# Patient Record
Sex: Female | Born: 1990
Health system: Southern US, Community
[De-identification: ages and names within clinical notes are randomized; demographics above are authoritative.]

## PROBLEM LIST (undated history)

## (undated) DIAGNOSIS — S92301A Fracture of unspecified metatarsal bone(s), right foot, initial encounter for closed fracture: Secondary | ICD-10-CM

## (undated) DIAGNOSIS — IMO0002 Reserved for concepts with insufficient information to code with codable children: Secondary | ICD-10-CM

## (undated) DIAGNOSIS — R109 Unspecified abdominal pain: Secondary | ICD-10-CM

## (undated) DIAGNOSIS — H6690 Otitis media, unspecified, unspecified ear: Secondary | ICD-10-CM

## (undated) HISTORY — DX: Fracture of unspecified metatarsal bone(s), right foot, initial encounter for closed fracture: S92.301A

## (undated) HISTORY — DX: Reserved for concepts with insufficient information to code with codable children: IMO0002

## (undated) HISTORY — PX: ANTERIOR CRUCIATE LIGAMENT REPAIR: SHX115

## (undated) HISTORY — PX: TYMPANOSTOMY: SHX2586

## (undated) HISTORY — DX: Otitis media, unspecified, unspecified ear: H66.90

## (undated) HISTORY — DX: Unspecified abdominal pain: R10.9

---
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
## 2012-01-16 ENCOUNTER — Other Ambulatory Visit: Payer: Self-pay | Admitting: Internal Medicine

## 2012-01-18 ENCOUNTER — Other Ambulatory Visit: Payer: Self-pay | Admitting: Internal Medicine

## 2012-03-14 ENCOUNTER — Other Ambulatory Visit: Payer: Self-pay | Admitting: Internal Medicine

## 2012-03-16 ENCOUNTER — Other Ambulatory Visit: Payer: Self-pay | Admitting: Internal Medicine

## 2012-03-20 ENCOUNTER — Other Ambulatory Visit: Payer: Self-pay | Admitting: Internal Medicine

## 2012-03-27 ENCOUNTER — Other Ambulatory Visit: Payer: Self-pay | Admitting: Internal Medicine

## 2012-07-25 ENCOUNTER — Telehealth: Payer: Self-pay | Admitting: Internal Medicine

## 2012-07-25 NOTE — Telephone Encounter (Signed)
Order abdominal ultrasound  be done first   Dx  abd pain possible hemangioma of liver   Then CPX preventive viist any time except Thursdays .    As a work ok  in  [employer]

## 2012-07-25 NOTE — Telephone Encounter (Signed)
PT coming home form school for the entire month of may. Pt's mother stated that the pt has a "spot on her liver", and that you had wanted her to repeat a scan that she had done previously. She would like to also have a physical. Please let me know what to schedule, and where to put it, considering your schedule. Thank you!

## 2012-07-28 ENCOUNTER — Other Ambulatory Visit: Payer: Self-pay | Admitting: Family Medicine

## 2012-07-28 DIAGNOSIS — R109 Unspecified abdominal pain: Secondary | ICD-10-CM

## 2012-07-28 NOTE — Telephone Encounter (Signed)
I put in the order for the ultrasound.   Please make CPE per [facility]  See below.

## 2012-07-29 NOTE — Telephone Encounter (Signed)
lmom for mom vm to callback

## 2012-07-31 NOTE — Telephone Encounter (Signed)
Pt is sch for 5-30

## 2012-08-04 ENCOUNTER — Ambulatory Visit
Admission: RE | Admit: 2012-08-04 | Discharge: 2012-08-04 | Disposition: A | Discharge status: Home / Self Care | Payer: 59 | Source: Ambulatory Visit | Attending: Internal Medicine | Admitting: Internal Medicine

## 2012-08-04 DIAGNOSIS — R109 Unspecified abdominal pain: Secondary | ICD-10-CM

## 2012-08-22 ENCOUNTER — Ambulatory Visit (INDEPENDENT_AMBULATORY_CARE_PROVIDER_SITE_OTHER): Payer: 59 | Admitting: Internal Medicine

## 2012-08-22 ENCOUNTER — Encounter: Payer: Self-pay | Admitting: Internal Medicine

## 2012-08-22 VITALS — BP 110/80 | HR 78 | Temp 98.1°F | Ht 70.5 in | Wt 169.0 lb

## 2012-08-22 DIAGNOSIS — Z113 Encounter for screening for infections with a predominantly sexual mode of transmission: Secondary | ICD-10-CM

## 2012-08-22 DIAGNOSIS — J45990 Exercise induced bronchospasm: Secondary | ICD-10-CM

## 2012-08-22 DIAGNOSIS — D1803 Hemangioma of intra-abdominal structures: Secondary | ICD-10-CM

## 2012-08-22 DIAGNOSIS — Z0000 Encounter for general adult medical examination without abnormal findings: Secondary | ICD-10-CM

## 2012-08-22 DIAGNOSIS — Z3041 Encounter for surveillance of contraceptive pills: Secondary | ICD-10-CM

## 2012-08-22 LAB — HEPATIC FUNCTION PANEL
AST: 19 U/L (ref 0–37)
Alkaline Phosphatase: 36 U/L — ABNORMAL LOW (ref 39–117)
Total Bilirubin: 0.7 mg/dL (ref 0.3–1.2)

## 2012-08-22 LAB — BASIC METABOLIC PANEL
BUN: 18 mg/dL (ref 6–23)
Calcium: 9.6 mg/dL (ref 8.4–10.5)
GFR: 91.27 mL/min (ref >60.00)
Glucose, Bld: 83 mg/dL (ref 70–99)

## 2012-08-22 LAB — CBC WITH DIFFERENTIAL/PLATELET
Basophils Absolute: 0 10*3/uL (ref 0.0–0.1)
Lymphocytes Relative: 49 % — ABNORMAL HIGH (ref 12.0–46.0)
Monocytes Relative: 10.7 % (ref 3.0–12.0)
Platelets: 240 10*3/uL (ref 150.0–400.0)
RDW: 13 % (ref 11.5–14.6)

## 2012-08-22 LAB — LIPID PANEL
Cholesterol: 203 mg/dL — ABNORMAL HIGH (ref 0–200)
Total CHOL/HDL Ratio: 4
VLDL: 7.4 mg/dL (ref 0.0–40.0)

## 2012-08-22 MED ORDER — NORETHIN ACE-ETH ESTRAD-FE 1-20 MG-MCG PO TABS: ORAL_TABLET | ORAL | Status: DC

## 2012-08-22 MED ORDER — ALBUTEROL SULFATE HFA 108 (90 BASE) MCG/ACT IN AERS: INHALATION_SPRAY | RESPIRATORY_TRACT | Status: DC

## 2012-08-22 NOTE — Patient Instructions (Signed)
Continue lifestyle intervention healthy eating and exercise . The  area on the liver is stable and appears like a benign hemangioma a very common  Condition.  It is small and stable and thus no need for regular checks at this time.    Will notify you  of labs when available. Sign up for my chart .    Health Maintenance, 80- to 20-Year-Old SCHOOL PERFORMANCE After high school completion, the young adult may be attending college, Scientist, product/process development or vocational school, or entering the [employer] or the work force. SOCIAL AND EMOTIONAL DEVELOPMENT The young adult establishes adult relationships and explores sexual identity. Young adults may be living at home or in a college dorm or apartment. Increasing independence is important with young adults. Throughout adolescence, teens should assume responsibility of their own health care. IMMUNIZATIONS Most young adults should be fully vaccinated. A booster dose of Tdap (tetanus, diphtheria, and pertussis, or "whooping cough"), a dose of meningococcal vaccine to protect against a certain type of bacterial meningitis, hepatitis A, human papillomarvirus (HPV), chickenpox, or measles vaccines may be indicated, if not given at an earlier age. Annual influenza or "flu" vaccination should be considered during flu season.  TESTING Annual screening for vision and hearing problems is recommended. Vision should be screened objectively at least once between 97 and 26 years of age. The young adult may be screened for anemia or tuberculosis. Young adults should have a blood test to check for high cholesterol during this time period. Young adults should be screened for use of alcohol and drugs. If the young adult is sexually active, screening for sexually transmitted infections, pregnancy, or HIV may be performed. Screening for cervical cancer should be performed within 3 years of beginning sexual activity. NUTRITION AND ORAL HEALTH  Adequate calcium intake is important. Consume 3  servings of low-fat milk and dairy products daily. For those who do not drink milk or consume dairy products, calcium enriched foods, such as juice, bread, or cereal, dark, leafy greens, or canned fish are alternate sources of calcium.  Drink plenty of water. Limit fruit juice to 8 to 12 ounces per day. Avoid sugary beverages or sodas.  Discourage skipping meals, especially breakfast. Teens should eat a good variety of vegetables and fruits, as well as lean meats.  Avoid high fat, high salt, and high sugar foods, such as candy, chips, and cookies.  Encourage young adults to participate in meal planning and preparation.  Eat meals together as a family whenever possible. Encourage conversation at mealtime.  Limit fast food choices and eating out at restaurants.  Brush teeth twice a day and floss.  Schedule dental exams twice a year. SLEEP Regular sleep habits are important. PHYSICAL, SOCIAL, AND EMOTIONAL DEVELOPMENT  One hour of regular physical activity daily is recommended. Continue to participate in sports.  Encourage young adults to develop their own interests and consider community service or volunteerism.  Provide guidance to the young adult in making decisions about college and work plans.  Make sure that young adults know that they should never be in a situation that makes them uncomfortable, and they should tell partners if they do not want to engage in sexual activity.  Talk to the young adult about body image. Eating disorders may be noted at this time. Young adults may also be concerned about being overweight. Monitor the young adult for weight gain or loss.  Mood disturbances, depression, anxiety, alcoholism, or attention problems may be noted in young adults. Talk to the caregiver  if there are concerns about mental illness.  Negotiate limit setting and independent decision making.  Encourage the young adult to handle conflict without physical violence.  Avoid loud  noises which may impair hearing.  Limit television and computer time to 2 hours per day. Individuals who engage in excessive sedentary activity are more likely to become overweight. RISK BEHAVIORS  Sexually active young adults need to take precautions against pregnancy and sexually transmitted infections. Talk to young adults about contraception.  Provide a tobacco-free and drug-free environment for the young adult. Talk to the young adult about drug, tobacco, and alcohol use among friends or at friends' homes. Make sure the young adult knows that smoking tobacco or marijuana and taking drugs have health consequences and may impact brain development.  Teach the young adult about appropriate use of over-the-counter or prescription medicines.  Establish guidelines for driving and for riding with friends.  Talk to young adults about the risks of drinking and driving or boating. Encourage the young adult to call you if he or she or friends have been drinking or using drugs.  Remind young adults to wear seat belts at all times in cars and life vests in boats.  Young adults should always wear a properly fitted helmet when they are riding a bicycle.  Use caution with all-terrain vehicles (ATVs) or other motorized vehicles.  Do not keep handguns in the home. (If you do, the gun and ammunition should be locked separately and out of the young adult's access.)  Equip your home with smoke detectors and change the batteries regularly. Make sure all family members know the fire escape plans for your home.  Teach young adults not to swim alone and not to dive in shallow water.  All individuals should wear sunscreen that protects against UVA and UVB light with at least a sun protection factor (SPF) of 30 when out in the sun. This minimizes sun burning. WHAT'S NEXT? Young adults should visit their pediatrician or family physician yearly. By young adulthood, health care should be transitioned to a family  physician or internal medicine specialist. Sexually active females may want to begin annual physical exams with a gynecologist. Document Released: 06/07/2006 Document Revised: 06/04/2011 Document Reviewed: 06/27/2006 [facility] Patient Information 2014 [place], [place].

## 2012-08-22 NOTE — Progress Notes (Signed)
Chief Complaint  Patient presents with  . Annual Exam    HPI: Patient comes in today for Preventive Health Care visit  No major change in health status since last visit . Still playing basketballl.  No major injury concern.     EIA  No flare uses albuterol pre  exercise Avoiding dairy and does well.   NO abd pain .   Had repeat US  Pre visit  OCPs work well for periods  And no cramps bleed 3 days  ROS:  GEN/ HEENT: No fever, significant weight changes sweats headaches vision problems hearing changes, CV/ PULM; No chest pain shortness of breath cough, syncope,edema  change in exercise tolerance. GI /GU: No adominal pain, vomiting, change in bowel habits. No blood in the stool. No significant GU symptoms. SKIN/HEME: ,no acute skin rashes suspicious lesions or bleeding. No lymphadenopathy, nodules, masses.  NEURO/ PSYCH:  No neurologic signs such as weakness numbness. No depression anxiety. IMM/ Allergy: No unusual infections.  Allergy .   REST of 12 system review negative except as per HPI   Past Medical History  Diagnosis Date  . OM (otitis media)     hx  . Abdominal pain, recurrent 06/20/2011  . SHIN SPLINTS 04/21/2009    Qualifier: Diagnosis of  By: Fabian Sharp MD, Neta Mends     Family History  Problem Relation Age of Onset  . Asthma Mother   . Hyperlipidemia Father   . Obesity Sister   . Asthma Brother     History   Social History  . Marital Status: Single    Spouse Name: N/A    Number of Children: N/A  . Years of Education: N/A   Social History Main Topics  . Smoking status: Passive Smoke Exposure - Never Smoker  . Smokeless tobacco: None  . Alcohol Use:   . Drug Use:   . Sexually Active:    Other Topics Concern  . None   Social History Narrative   HH of 5   Born in [place]   Father smokes not in house   Pet dog   GDS  Graduated    Basket ball and  Track      [employer] on [employer] scholarship   Had a good freshman year  Undeclared major              Outpatient Encounter Prescriptions as of 08/22/2012  Medication Sig Dispense Refill  . albuterol (VENTOLIN HFA) 108 (90 BASE) MCG/ACT inhaler 2 puffs pre exercise  1 hours  2 Inhaler  2  . norethindrone-ethinyl estradiol (JUNEL FE 1/20) 1-20 MG-MCG tablet TAKE 1 TABLET BY MOUTH DAILY.  3 Package  3  . [DISCONTINUED] albuterol (VENTOLIN HFA) 108 (90 BASE) MCG/ACT inhaler 2 puffs pre exercise  1 hours  1 Inhaler  2  . [DISCONTINUED] JUNEL FE 1/20 1-20 MG-MCG tablet TAKE 1 TABLET BY MOUTH DAILY.  28 tablet  1  . [DISCONTINUED] JUNEL FE 1/20 1-20 MG-MCG tablet TAKE 1 TABLET BY MOUTH DAILY.  28 tablet  5  . [DISCONTINUED] JUNEL FE 1/20 1-20 MG-MCG tablet TAKE 1 TABLET BY MOUTH DAILY.  28 tablet  0  . [DISCONTINUED] Lactic Acid 10 % LOTN APPLY TO UPPER ARM AREA TWO TIMES A DAY OR AS DIRECTED  354.84 mL  0  . [DISCONTINUED] selenium sulfide (SELSUN) 2.5 % shampoo APPLY TO TRUNK & LEAVE ON OVER NIGHT. WASH OFF THE NEXT DAY. USE FOR 1 WEEK, THEN PERIODICALLY  120 mL  0  No facility-administered encounter medications on file as of 08/22/2012.    EXAM:  BP 110/80  Pulse 78  Temp(Src) 98.1 F (36.7 C) (Oral)  Ht 5' 10.5" (1.791 m)  Wt 169 lb (76.658 kg)  BMI 23.9 kg/m2  SpO2 98%  LMP 08/18/2012  Body mass index is 23.9 kg/(m^2).  Physical Exam: Vital signs reviewed [id number]:WRUE is a well-developed well-nourished alert cooperative   female who appears her stated age in no acute distress.  HEENT: normocephalic atraumatic , Eyes: PERRL EOM's full, conjunctiva clear, Nares: paten,t no deformity discharge or tenderness., Ears: no deformity EAC's clear TMs with normal landmarks. Mouth: clear OP, no lesions, edema.  Moist mucous membranes. Dentition in adequate repair. NECK: supple without masses, thyromegaly or bruits. CHEST/PULM:  Clear to auscultation and percussion breath sounds equal no wheeze , rales or rhonchi. No chest wall deformities or tenderness. CV: PMI is nondisplaced, S1 S2 no gallops,  murmurs, rubs. Peripheral pulses are full without delay.No JVD .  Breast: normal by inspection . No dimpling, discharge, masses, tenderness or discharge . Some lumipness no discrete lump. ABDOMEN: Bowel sounds normal nontender  No guard or rebound, no hepato splenomegal no CVA tenderness.  No hernia. Extremtities:  No clubbing cyanosis or edema, no acute joint swelling or redness no focal atrophy NEURO:  Oriented x3, cranial nerves 3-12 appear to be intact, no obvious focal weakness,gait within normal limits no abnormal reflexes or asymmetrical SKIN: No acute rashes normal turgor, color, no bruising or petechiae.  Faded tinea? On back  No acute rashes PSYCH: Oriented, good eye contact, no obvious depression anxiety, cognition and judgment appear normal. LN: no cervical axillary inguinal adenopathy Screening ortho / MS exam: normal;  No scoliosis ,LOM , joint swelling or gait disturbance . Muscle mass is normal .    Lab Results  Component Value Date   WBC 3.0* 08/22/2012   HGB 14.0 08/22/2012   HCT 41.4 08/22/2012   PLT 240.0 08/22/2012   GLUCOSE 83 08/22/2012   CHOL 203* 08/22/2012   TRIG 37.0 08/22/2012   HDL 53.30 08/22/2012   LDLDIRECT 144.3 08/22/2012   ALT 14 08/22/2012   AST 19 08/22/2012   NA 140 08/22/2012   K 3.9 08/22/2012   CL 108 08/22/2012   CREATININE 1.0 08/22/2012   BUN 18 08/22/2012   CO2 26 08/22/2012   TSH 2.06 08/22/2012   Wt Readings from Last 3 Encounters:  08/22/12 169 lb (76.658 kg) (91%*, Z = 1.37)  09/14/11 162 lb (73.483 kg) (90%*, Z = 1.27)  07/11/11 157 lb (71.215 kg) (88%*, Z = 1.16)   * Growth percentiles are based on CDC 2-20 Years data.    ASSESSMENT AND PLAN:  Discussed the following assessment and plan:  Visit for preventive health examination - utd disc screening - Plan: Basic metabolic panel, CBC with Differential, Hepatic function panel, Lipid panel, TSH  Hemangioma of liver 1.8 cm on Ultrasound 2013 - no change appears benign  2014  Exercise-induced asthma - refill med  Oral contraceptive use  - ascceptable candidate   Routine screening for STI (sexually transmitted infection) - Plan: GC/chlamydia probe amp, urine Counseled regarding healthy nutrition, exercise, sleep, injury prevention, calcium vit d and healthy weight . Refills given  Can contact pharmacy about getting refills at school  Or [place] if needed to transfer rx. Patient Care Team: Madelin Headings, MD as PCP - General Patient Instructions  Continue lifestyle intervention healthy eating and exercise . The  area on the  liver is stable and appears like a benign hemangioma a very common  Condition.  It is small and stable and thus no need for regular checks at this time.    Will notify you  of labs when available. Sign up for my chart .    Health Maintenance, 36- to 37-Year-Old SCHOOL PERFORMANCE After high school completion, the young adult may be attending college, Scientist, product/process development or vocational school, or entering the [employer] or the work force. SOCIAL AND EMOTIONAL DEVELOPMENT The young adult establishes adult relationships and explores sexual identity. Young adults may be living at home or in a college dorm or apartment. Increasing independence is important with young adults. Throughout adolescence, teens should assume responsibility of their own health care. IMMUNIZATIONS Most young adults should be fully vaccinated. A booster dose of Tdap (tetanus, diphtheria, and pertussis, or "whooping cough"), a dose of meningococcal vaccine to protect against a certain type of bacterial meningitis, hepatitis A, human papillomarvirus (HPV), chickenpox, or measles vaccines may be indicated, if not given at an earlier age. Annual influenza or "flu" vaccination should be considered during flu season.  TESTING Annual screening for vision and hearing problems is recommended. Vision should be screened objectively at least once between 39 and 46 years of age. The young adult may  be screened for anemia or tuberculosis. Young adults should have a blood test to check for high cholesterol during this time period. Young adults should be screened for use of alcohol and drugs. If the young adult is sexually active, screening for sexually transmitted infections, pregnancy, or HIV may be performed. Screening for cervical cancer should be performed within 3 years of beginning sexual activity. NUTRITION AND ORAL HEALTH  Adequate calcium intake is important. Consume 3 servings of low-fat milk and dairy products daily. For those who do not drink milk or consume dairy products, calcium enriched foods, such as juice, bread, or cereal, dark, leafy greens, or canned fish are alternate sources of calcium.  Drink plenty of water. Limit fruit juice to 8 to 12 ounces per day. Avoid sugary beverages or sodas.  Discourage skipping meals, especially breakfast. Teens should eat a good variety of vegetables and fruits, as well as lean meats.  Avoid high fat, high salt, and high sugar foods, such as candy, chips, and cookies.  Encourage young adults to participate in meal planning and preparation.  Eat meals together as a family whenever possible. Encourage conversation at mealtime.  Limit fast food choices and eating out at restaurants.  Brush teeth twice a day and floss.  Schedule dental exams twice a year. SLEEP Regular sleep habits are important. PHYSICAL, SOCIAL, AND EMOTIONAL DEVELOPMENT  One hour of regular physical activity daily is recommended. Continue to participate in sports.  Encourage young adults to develop their own interests and consider community service or volunteerism.  Provide guidance to the young adult in making decisions about college and work plans.  Make sure that young adults know that they should never be in a situation that makes them uncomfortable, and they should tell partners if they do not want to engage in sexual activity.  Talk to the young adult  about body image. Eating disorders may be noted at this time. Young adults may also be concerned about being overweight. Monitor the young adult for weight gain or loss.  Mood disturbances, depression, anxiety, alcoholism, or attention problems may be noted in young adults. Talk to the caregiver if there are concerns about mental illness.  Negotiate limit setting and  independent decision making.  Encourage the young adult to handle conflict without physical violence.  Avoid loud noises which may impair hearing.  Limit television and computer time to 2 hours per day. Individuals who engage in excessive sedentary activity are more likely to become overweight. RISK BEHAVIORS  Sexually active young adults need to take precautions against pregnancy and sexually transmitted infections. Talk to young adults about contraception.  Provide a tobacco-free and drug-free environment for the young adult. Talk to the young adult about drug, tobacco, and alcohol use among friends or at friends' homes. Make sure the young adult knows that smoking tobacco or marijuana and taking drugs have health consequences and may impact brain development.  Teach the young adult about appropriate use of over-the-counter or prescription medicines.  Establish guidelines for driving and for riding with friends.  Talk to young adults about the risks of drinking and driving or boating. Encourage the young adult to call you if he or she or friends have been drinking or using drugs.  Remind young adults to wear seat belts at all times in cars and life vests in boats.  Young adults should always wear a properly fitted helmet when they are riding a bicycle.  Use caution with all-terrain vehicles (ATVs) or other motorized vehicles.  Do not keep handguns in the home. (If you do, the gun and ammunition should be locked separately and out of the young adult's access.)  Equip your home with smoke detectors and change the batteries  regularly. Make sure all family members know the fire escape plans for your home.  Teach young adults not to swim alone and not to dive in shallow water.  All individuals should wear sunscreen that protects against UVA and UVB light with at least a sun protection factor (SPF) of 30 when out in the sun. This minimizes sun burning. WHAT'S NEXT? Young adults should visit their pediatrician or family physician yearly. By young adulthood, health care should be transitioned to a family physician or internal medicine specialist. Sexually active females may want to begin annual physical exams with a gynecologist. Document Released: 06/07/2006 Document Revised: 06/04/2011 Document Reviewed: 06/27/2006 [facility] Patient Information 2014 [place], [place].     Neta Mends. Dory Verdun M.D.

## 2012-08-23 ENCOUNTER — Encounter: Payer: Self-pay | Admitting: Internal Medicine

## 2012-08-23 DIAGNOSIS — Z113 Encounter for screening for infections with a predominantly sexual mode of transmission: Secondary | ICD-10-CM | POA: Insufficient documentation

## 2012-08-25 LAB — GC/CHLAMYDIA PROBE AMP, URINE: GC Probe Amp, Urine: NEGATIVE

## 2012-09-01 ENCOUNTER — Telehealth: Payer: Self-pay | Admitting: Family Medicine

## 2012-09-01 NOTE — Telephone Encounter (Signed)
Her lab work was sent for me to save for her next OV.  She does not have an upcoming appt.  What would you have me tell her about her labs?

## 2012-09-02 ENCOUNTER — Telehealth: Payer: Self-pay | Admitting: Family Medicine

## 2012-09-02 NOTE — Telephone Encounter (Signed)
eRROR 

## 2012-09-02 NOTE — Telephone Encounter (Signed)
Yes send her a copy of labs  See result note.

## 2012-09-03 NOTE — Telephone Encounter (Signed)
Done

## 2012-09-18 ENCOUNTER — Other Ambulatory Visit: Payer: Self-pay | Admitting: Internal Medicine

## 2012-09-22 ENCOUNTER — Other Ambulatory Visit: Payer: Self-pay | Admitting: Internal Medicine

## 2012-09-23 ENCOUNTER — Other Ambulatory Visit: Payer: Self-pay | Admitting: Internal Medicine

## 2012-10-02 ENCOUNTER — Encounter: Payer: Self-pay | Admitting: Internal Medicine

## 2012-10-02 ENCOUNTER — Ambulatory Visit (INDEPENDENT_AMBULATORY_CARE_PROVIDER_SITE_OTHER): Payer: 59 | Admitting: Internal Medicine

## 2012-10-02 VITALS — BP 100/60 | HR 75 | Temp 98.6°F | Wt 170.0 lb

## 2012-10-02 DIAGNOSIS — H6122 Impacted cerumen, left ear: Secondary | ICD-10-CM

## 2012-10-02 DIAGNOSIS — H938X2 Other specified disorders of left ear: Secondary | ICD-10-CM

## 2012-10-02 DIAGNOSIS — H6120 Impacted cerumen, unspecified ear: Secondary | ICD-10-CM

## 2012-10-02 DIAGNOSIS — H938X9 Other specified disorders of ear, unspecified ear: Secondary | ICD-10-CM

## 2012-10-02 DIAGNOSIS — H6982 Other specified disorders of Eustachian tube, left ear: Secondary | ICD-10-CM

## 2012-10-02 DIAGNOSIS — H6980 Other specified disorders of Eustachian tube, unspecified ear: Secondary | ICD-10-CM | POA: Insufficient documentation

## 2012-10-02 NOTE — Progress Notes (Signed)
Chief Complaint  Patient presents with  . Otalgia    Left side.  Ongoing for 1 week.    HPI: Patient comes in today for SDA for  new problem evaluation. Stuffy feeling off and on for about a week  And then discomfort this morning and then pop this am  And then proved symptoms no drainage now feels like it is clogging up some again down no drainage  Now  clogging up again.    Not really pain  at all .  Has had a history of otitis media in the past that she believes   No fever upper respiratory cold symptoms or increased allergy symptoms. No recent swimming diving has put nothing in her ear. ROS: See pertinent positives and negatives per HPI.  Past Medical History  Diagnosis Date  . OM (otitis media)     hx  . Abdominal pain, recurrent 06/20/2011  . SHIN SPLINTS 04/21/2009    Qualifier: Diagnosis of  By: Fabian Sharp MD, Neta Mends     Family History  Problem Relation Age of Onset  . Asthma Mother   . Hyperlipidemia Father   . Obesity Sister   . Asthma Brother     History   Social History  . Marital Status: Single    Spouse Name: N/A    Number of Children: N/A  . Years of Education: N/A   Social History Main Topics  . Smoking status: Passive Smoke Exposure - Never Smoker  . Smokeless tobacco: None  . Alcohol Use:   . Drug Use:   . Sexually Active:    Other Topics Concern  . None   Social History Narrative   HH of 5   Born in [place]   Father smokes not in house   Pet dog   GDS  Graduated    Basket ball and  Track      [employer] on [employer] scholarship   Had a good freshman year  Undeclared major             Outpatient Encounter Prescriptions as of 10/02/2012  Medication Sig Dispense Refill  . albuterol (VENTOLIN HFA) 108 (90 BASE) MCG/ACT inhaler 2 puffs pre exercise  1 hours  2 Inhaler  2  . JUNEL FE 1/20 1-20 MG-MCG tablet TAKE 1 TABLET BY MOUTH DAILY.  28 tablet  5   No facility-administered encounter medications on file as of 10/02/2012.    EXAM:  BP 100/60   Pulse 75  Temp(Src) 98.6 F (37 C) (Oral)  Wt 170 lb (77.111 kg)  BMI 24.04 kg/m2  SpO2 98%  LMP 09/17/2012  Body mass index is 24.04 kg/(m^2).  GENERAL: vitals reviewed and listed above, alert, oriented, appears well hydrated and in no acute distress  HEENT: atraumatic, conjunctiva  clear, no obvious abnormalities on inspection of external nose and ears right EAC small amount of wax TM intact clear bony landmarks left EAC occluded with soft wax irrigated wax almost entirely removed TM intact without acute abnormalities no obvious effusion. Face nontender  OP : no lesion edema or exudate  NECK: no obvious masses on inspection palpation no adenopathy MS: moves all extremities without noticeable focal  abnormality PSYCH: pleasant and cooperative, no obvious depression or anxiety  ASSESSMENT AND PLAN:  Discussed the following assessment and plan:  Clogged ear, left - Most likely from wax impaction however she could have some mild eustachian tube dysfunction she is a lot better after wax removal  Excess wax in ear,  left  Eustachian tube dysfunction, left Discussed differential diagnosis possible eustachian tube dysfunction can try Sudafed if needed give it time if persistent and progressive or recurs come back or contact us for advice no infection today -Patient advised to return or notify health care team  if symptoms worsen or persist or new concerns arise.  Patient Instructions  This is not  An  ear infection .  However could have eustachian tube dysfunction and wax clogging the area.  Can try  Sudafed if needed. Or nothing   No q tips or  Objects in ears.   As you haven't seen.     Neta Mends. Kylle Lall M.D.

## 2012-10-02 NOTE — Patient Instructions (Addendum)
This is not  An  ear infection .  However could have eustachian tube dysfunction and wax clogging the area.  Can try  Sudafed if needed. Or nothing   No q tips or  Objects in ears.   As you haven't seen.

## 2013-06-29 IMAGING — US US ABDOMEN COMPLETE
1 series · 14 of 25 positions shown · non-contrast
Comparison: None.

CLINICAL DATA: Intermittent mid to right abdomen pain, some weight
loss

COMPLETE ABDOMINAL ULTRASOUND

[Series 1: us abdomen complete · 0.26mm/px · 14 of 87 slices shown]
[im 1/87]
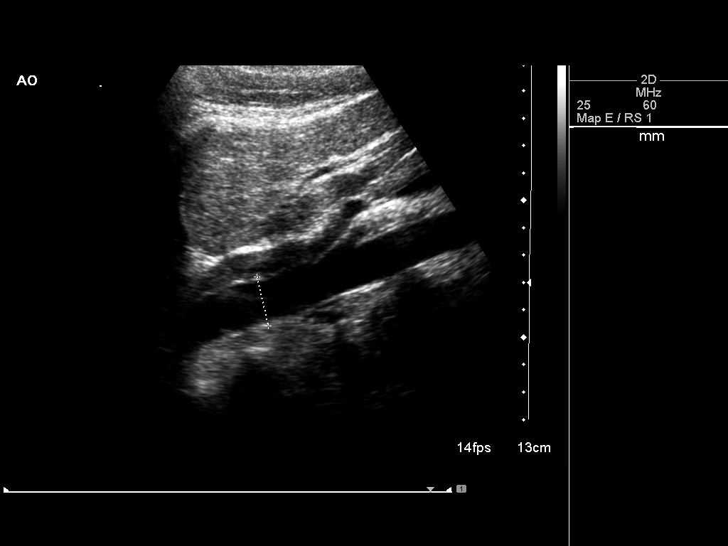
[im 8/87]
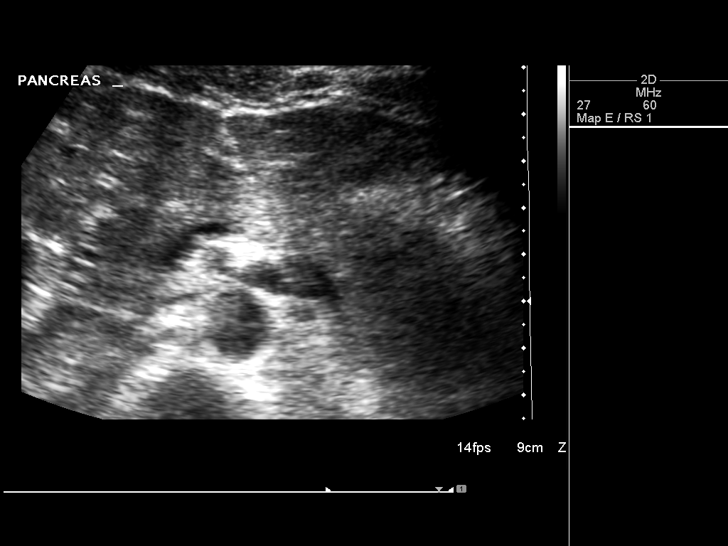
[im 15/87]
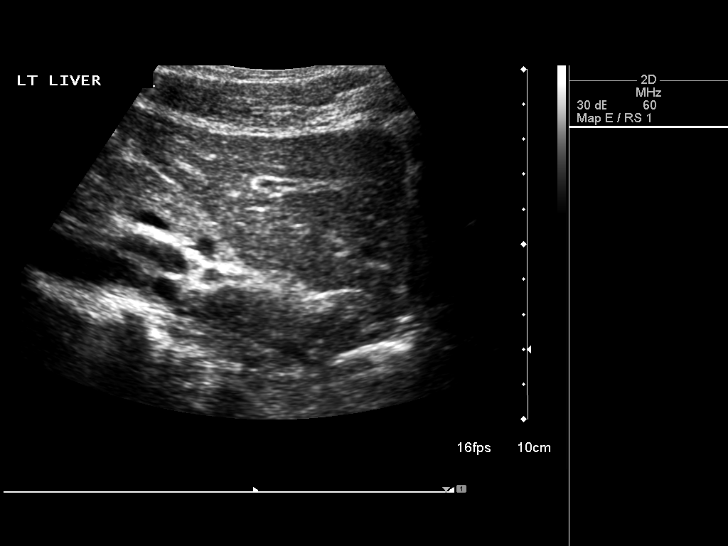
[im 22/87]
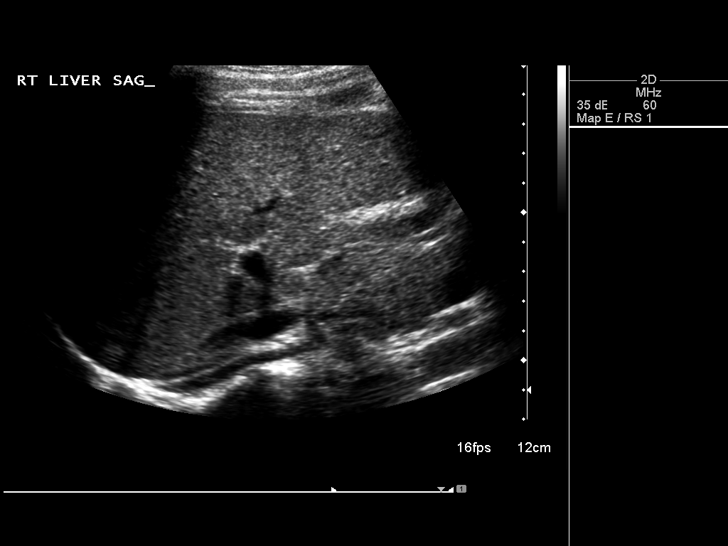
[im 29/87]
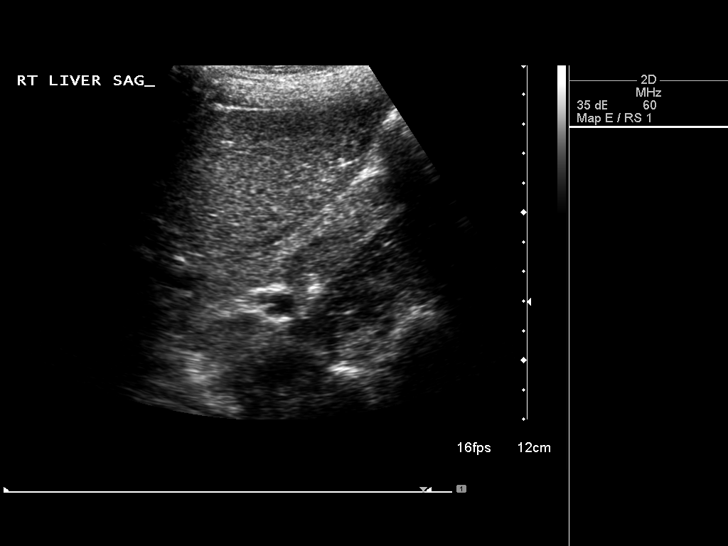
[im 33/87]
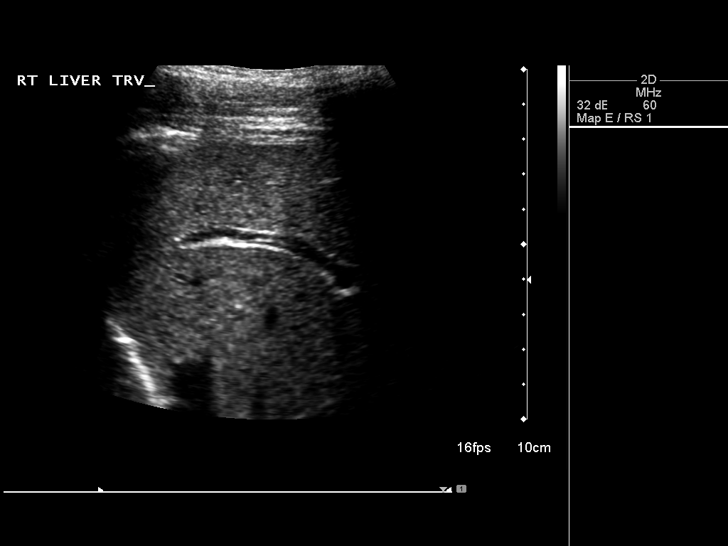
[im 40/87]
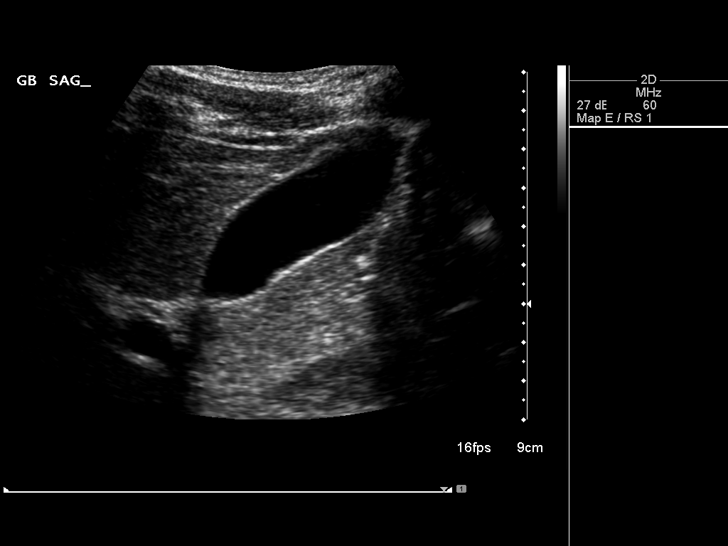
[im 47/87]
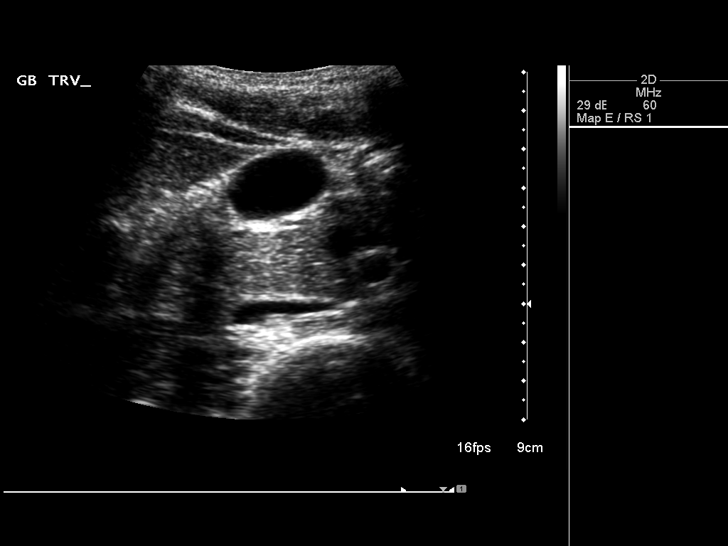
[im 54/87]
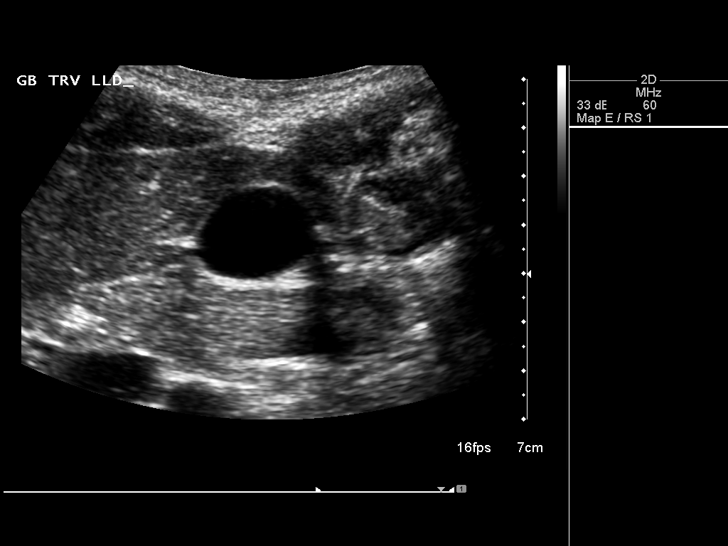
[im 58/87]
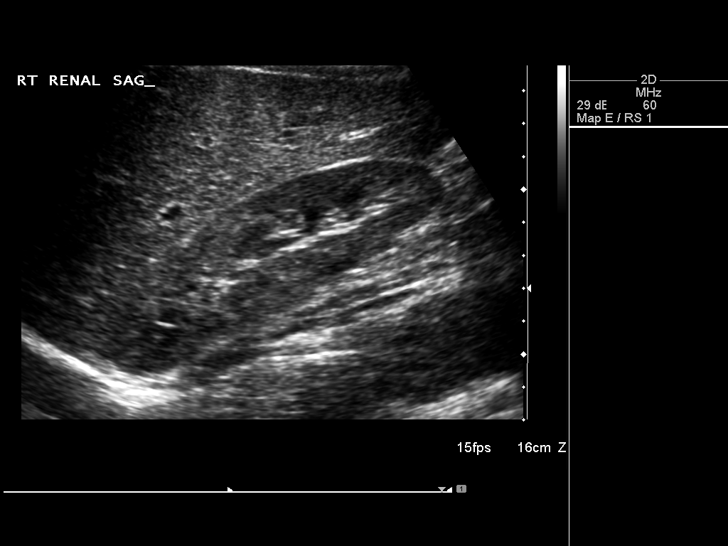
[im 65/87]
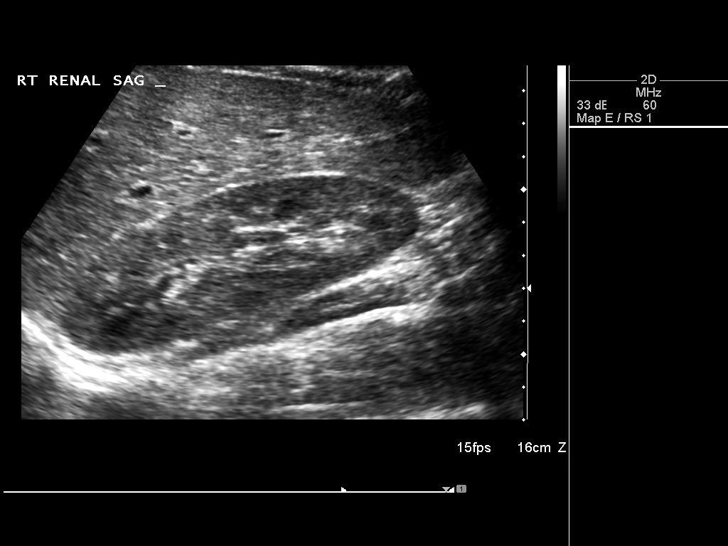
[im 72/87]
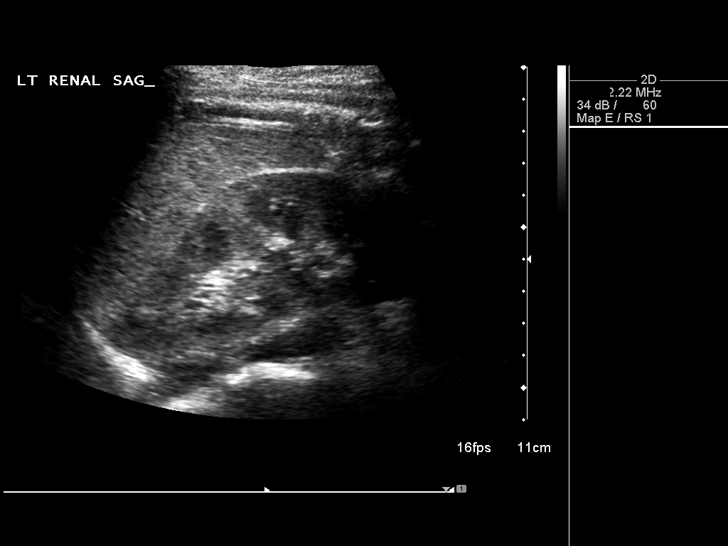
[im 79/87]
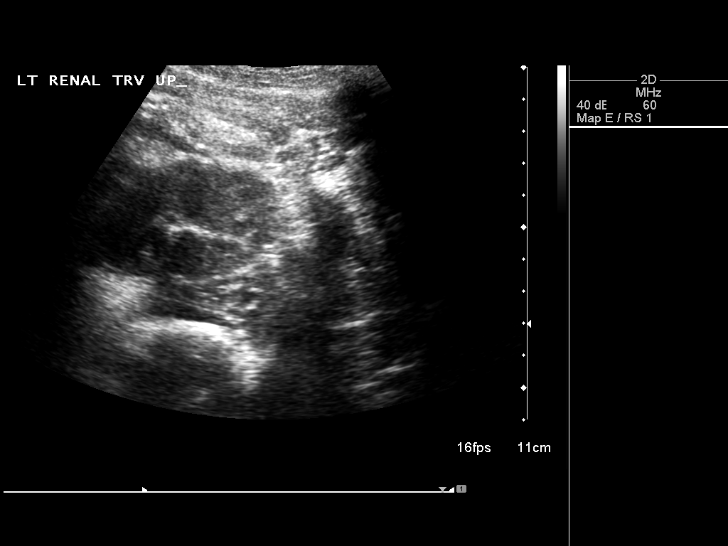
[im 87/87]
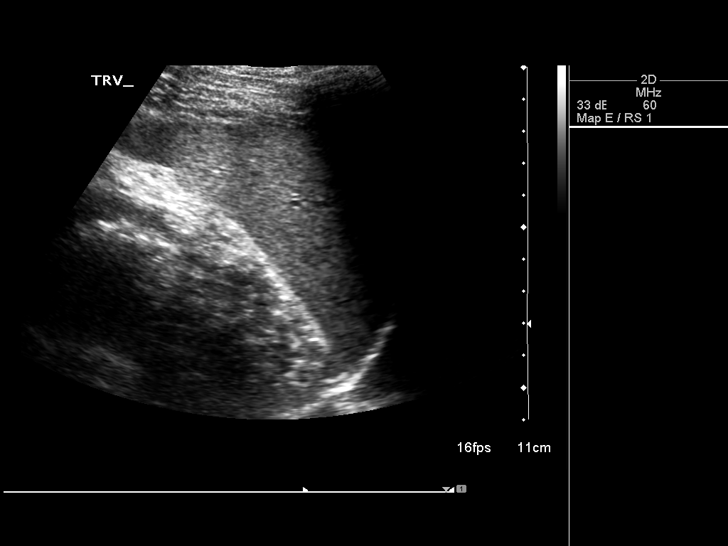

[14 of 25 positions shown; findings below may reference images not displayed]

FINDINGS: Gallbladder:  The gallbladder is visualized and no gallstones are
noted.  There is no pain over the gallbladder with compression.

Common bile duct:  The common bile duct is normal measuring 3.2 mm
in diameter.

Liver:  The liver has a normal echogenic pattern with exception of
a single small echogenic focus posteriorly in the right lobe
consistent with hemangioma of 1.5 x 1.7 x 1.8 cm.

IVC:  Appears normal.

Pancreas:  No focal abnormality seen.

Spleen:  The spleen is normal measuring 7.6 cm sagittally.

Right Kidney:  No hydronephrosis is seen.  The right kidney
measures 11.6 cm sagittally.

Left Kidney:  No hydronephrosis.  The left kidney measures 10.7 cm.

Abdominal aorta:  The abdominal aorta is normal in caliber.
IMPRESSION: Negative abdominal ultrasound other than a probable hemangioma in
the right lobe of liver of 1.8 cm maximum diameter.

## 2013-08-20 ENCOUNTER — Other Ambulatory Visit: Payer: Self-pay | Admitting: Internal Medicine

## 2013-08-20 NOTE — Telephone Encounter (Signed)
Sent to the pharmacy.  Pt has upcoming CPE.

## 2013-10-07 ENCOUNTER — Ambulatory Visit (INDEPENDENT_AMBULATORY_CARE_PROVIDER_SITE_OTHER): Payer: 59 | Admitting: Internal Medicine

## 2013-10-07 ENCOUNTER — Encounter: Payer: Self-pay | Admitting: Internal Medicine

## 2013-10-07 VITALS — BP 126/72 | Temp 99.0°F | Ht 71.0 in | Wt 177.0 lb

## 2013-10-07 DIAGNOSIS — Z0000 Encounter for general adult medical examination without abnormal findings: Secondary | ICD-10-CM

## 2013-10-07 DIAGNOSIS — Z8739 Personal history of other diseases of the musculoskeletal system and connective tissue: Secondary | ICD-10-CM

## 2013-10-07 DIAGNOSIS — Z3041 Encounter for surveillance of contraceptive pills: Secondary | ICD-10-CM

## 2013-10-07 DIAGNOSIS — Z113 Encounter for screening for infections with a predominantly sexual mode of transmission: Secondary | ICD-10-CM

## 2013-10-07 DIAGNOSIS — Z87828 Personal history of other (healed) physical injury and trauma: Secondary | ICD-10-CM | POA: Insufficient documentation

## 2013-10-07 LAB — BASIC METABOLIC PANEL
BUN: 10 mg/dL (ref 6–23)
CALCIUM: 9.5 mg/dL (ref 8.4–10.5)
CO2: 25 meq/L (ref 19–32)
Chloride: 109 mEq/L (ref 96–112)
Creatinine, Ser: 1 mg/dL (ref 0.4–1.2)
GFR: 93.47 mL/min (ref >60.00)
Glucose, Bld: 90 mg/dL (ref 70–99)
POTASSIUM: 3.7 meq/L (ref 3.5–5.1)
SODIUM: 140 meq/L (ref 135–145)

## 2013-10-07 LAB — CBC WITH DIFFERENTIAL/PLATELET
BASOS PCT: 0.3 % (ref 0.0–3.0)
Basophils Absolute: 0 10*3/uL (ref 0.0–0.1)
Eosinophils Absolute: 0 10*3/uL (ref 0.0–0.7)
Eosinophils Relative: 1.2 % (ref 0.0–5.0)
HEMATOCRIT: 39.7 % (ref 36.0–46.0)
HEMOGLOBIN: 13.2 g/dL (ref 12.0–15.0)
LYMPHS ABS: 1.4 10*3/uL (ref 0.7–4.0)
LYMPHS PCT: 44.3 % (ref 12.0–46.0)
MCHC: 33.3 g/dL (ref 30.0–36.0)
MCV: 89.2 fl (ref 78.0–100.0)
MONOS PCT: 8.8 % (ref 3.0–12.0)
Monocytes Absolute: 0.3 10*3/uL (ref 0.1–1.0)
NEUTROS ABS: 1.5 10*3/uL (ref 1.4–7.7)
Neutrophils Relative %: 45.4 % (ref 43.0–77.0)
Platelets: 277 10*3/uL (ref 150.0–400.0)
RBC: 4.46 Mil/uL (ref 3.87–5.11)
RDW: 13.5 % (ref 11.5–14.6)
WBC: 3.3 10*3/uL — ABNORMAL LOW (ref 4.5–10.5)

## 2013-10-07 LAB — HEPATIC FUNCTION PANEL
ALBUMIN: 4.3 g/dL (ref 3.5–5.2)
ALK PHOS: 45 U/L (ref 39–117)
ALT: 16 U/L (ref 0–35)
AST: 24 U/L (ref 0–37)
Bilirubin, Direct: 0.1 mg/dL (ref 0.0–0.3)
TOTAL PROTEIN: 8 g/dL (ref 6.0–8.3)
Total Bilirubin: 0.7 mg/dL (ref 0.2–1.2)

## 2013-10-07 LAB — TSH: TSH: 0.61 u[IU]/mL (ref 0.35–5.50)

## 2013-10-07 LAB — LIPID PANEL
Cholesterol: 194 mg/dL (ref 0–200)
HDL: 53.2 mg/dL (ref >39.00)
LDL Cholesterol: 134 mg/dL — ABNORMAL HIGH (ref 0–99)
NONHDL: 140.8
Total CHOL/HDL Ratio: 4
Triglycerides: 36 mg/dL (ref 0.0–149.0)
VLDL: 7.2 mg/dL (ref 0.0–40.0)

## 2013-10-07 MED ORDER — NORETHIN ACE-ETH ESTRAD-FE 1-20 MG-MCG PO TABS: ORAL_TABLET | ORAL | Status: DC

## 2013-10-07 NOTE — Patient Instructions (Addendum)
Continue lifestyle intervention healthy eating and exercise . 150 minutes of exercise weeks  ,   weight  To healthy levels. Avoid trans fats and processed foods;  Increase fresh fruits and veges to 5 servings per day. And avoid sweet beverages  Including tea and juice. Mediterranean diet with olive oil and nuts have been noted to be heart and brain healthy .  Will notify you  of labs when available. Preventive visit in 1 year  Due for pap smear when you are 23 years. Your immunizations are Up to date. Stay healthy!

## 2013-10-07 NOTE — Progress Notes (Signed)
Pre visit review using our clinic review tool, if applicable. No additional management support is needed unless otherwise documented below in the visit note.  Chief Complaint  Patient presents with  . Annual Exam    HPI: Patient comes in today for Preventive Health Care visit  Update : Left acl repair  ; Raliegh Ip  . Rehabing.  Back to normal  Happened during Pick up basketball and landed wrong.  School going well sports management   Basketball training this summer  No classes  1 part  Periods light  ok on ocps  Please renew no gu gi sx .   Health Maintenance  Topic Date Due  . Tetanus/tdap  01/12/2012  . Influenza Vaccine  10/24/2013   Health Maintenance Review LIFESTYLE:  Exercise:  yes Tobacco/ETS:no Alcohol: no Sugar beverages: soda ocass juice 2-3 per day.  Water.  Sleep: 6-7 hours  Drug use: no  ROS:  GEN/ HEENT: No fever, significant weight changes sweats headaches vision problems hearing changes, CV/ PULM; No chest pain shortness of breath cough, syncope,edema  change in exercise tolerance. GI /GU: No adominal pain, vomiting, change in bowel habits. No blood in the stool. No significant GU symptoms. SKIN/HEME: ,no acute skin rashes suspicious lesions or bleeding. No lymphadenopathy, nodules, masses.  NEURO/ PSYCH:  No neurologic signs such as weakness numbness. No depression anxiety. IMM/ Allergy: No unusual infections.  Allergy .   REST of 12 system review negative except as per HPI   Past Medical History  Diagnosis Date  . OM (otitis media)     hx  . Abdominal pain, recurrent 06/20/2011  . SHIN SPLINTS 04/21/2009    Qualifier: Diagnosis of  By: Regis Bill MD, Standley Brooking    Past Surgical History  Procedure Laterality Date  . Tympanostomy      age 68  . Anterior cruciate ligament repair Left     murphy wainer 2015    Family History  Problem Relation Age of Onset  . Asthma Mother   . Hyperlipidemia Father   . Obesity Sister   . Asthma Brother      History   Social History  . Marital Status: Single    Spouse Name: N/A    Number of Children: N/A  . Years of Education: N/A   Social History Main Topics  . Smoking status: Passive Smoke Exposure - Never Smoker  . Smokeless tobacco: None  . Alcohol Use:   . Drug Use:   . Sexual Activity:    Other Topics Concern  . None   Social History Narrative   HH of 5   Born in [place]   Father smokes not in house   Pet dog   GDS  Graduated    Basket ball and  Track      [employer] on [employer] scholarship   Sports management                Outpatient Encounter Prescriptions as of 10/07/2013  Medication Sig  . albuterol (VENTOLIN HFA) 108 (90 BASE) MCG/ACT inhaler 2 puffs pre exercise  1 hours  . norethindrone-ethinyl estradiol (JUNEL FE 1/20) 1-20 MG-MCG tablet TAKE 1 TABLET BY MOUTH DAILY.  . [DISCONTINUED] JUNEL FE 1/20 1-20 MG-MCG tablet TAKE 1 TABLET BY MOUTH DAILY.  . [DISCONTINUED] JUNEL FE 1/20 1-20 MG-MCG tablet TAKE 1 TABLET BY MOUTH DAILY.    EXAM:  BP 126/72  Temp(Src) 99 F (37.2 C) (Oral)  Ht 5\' 11"  (1.803 m)  Wt 177  lb (80.287 kg)  BMI 24.70 kg/m2  Body mass index is 24.7 kg/(m^2).  Physical Exam: Vital signs reviewed KPV:VZSM is a well-developed well-nourished alert cooperative    who appearsr stated age in no acute distress.  HEENT: normocephalic atraumatic , Eyes: PERRL EOM's full, conjunctiva clear, Nares: paten,t no deformity discharge or tenderness., Ears: no deformity EAC's clear TMs with normal landmarks. Mouth: clear OP, no lesions, edema.  Moist mucous membranes. Dentition in adequate repair. NECK: supple without masses, thyromegaly or bruits. Breast: normal by inspection . No dimpling, discharge, masses, tenderness or discharge . CHEST/PULM:  Clear to auscultation and percussion breath sounds equal no wheeze , rales or rhonchi. No chest wall deformities or tenderness. CV: PMI is nondisplaced, S1 S2 no gallops, murmurs, rubs. Peripheral pulses  are full without delay.No JVD .  ABDOMEN: Bowel sounds normal nontender  No guard or rebound, no hepato splenomegal no CVA tenderness.  No hernia. Extremtities:  No clubbing cyanosis or edema, no acute joint swelling or redness no focal atrophy minimal scar left knee NEURO:  Oriented x3, cranial nerves 3-12 appear to be intact, no obvious focal weakness,gait within normal limits no abnormal reflexes or asymmetrical SKIN: No acute rashes normal turgor, color, no bruising or petechiae. PSYCH: Oriented, good eye contact, no obvious depression anxiety, cognition and judgment appear normal. LN: no cervical axillary inguinal adenopathy  ASSESSMENT AND PLAN:  Discussed the following assessment and plan:  Visit for preventive health examination - utd immuniz  - Plan: Basic metabolic panel, CBC with Differential, Hepatic function panel, Lipid panel, TSH, HIV antibody, GC/chlamydia probe amp, urine, RPR  Oral contraceptive use - acceptable candidate refill x 1 year pap next year - Plan: Basic metabolic panel, CBC with Differential, Hepatic function panel, Lipid panel, TSH, HIV antibody, GC/chlamydia probe amp, urine, RPR  Routine screening for STI (sexually transmitted infection) - low risk - Plan: Basic metabolic panel, CBC with Differential, Hepatic function panel, Lipid panel, TSH, HIV antibody, GC/chlamydia probe amp, urine, RPR  Patient Care Team: Burnis Medin, MD as PCP - General Patient Instructions  Continue lifestyle intervention healthy eating and exercise . 150 minutes of exercise weeks  ,   weight  To healthy levels. Avoid trans fats and processed foods;  Increase fresh fruits and veges to 5 servings per day. And avoid sweet beverages  Including tea and juice. Mediterranean diet with olive oil and nuts have been noted to be heart and brain healthy .  Will notify you  of labs when available. Preventive visit in 1 year  Due for pap smear when you are 23 years. Your immunizations are Up  to date. Stay healthy!    Standley Brooking. Panosh M.D.

## 2013-10-08 LAB — GC/CHLAMYDIA PROBE AMP, URINE
CHLAMYDIA, SWAB/URINE, PCR: NEGATIVE
GC PROBE AMP, URINE: NEGATIVE

## 2013-10-08 LAB — HIV ANTIBODY (ROUTINE TESTING W REFLEX): HIV: NONREACTIVE

## 2013-10-08 LAB — RPR

## 2013-10-18 ENCOUNTER — Emergency Department (INDEPENDENT_AMBULATORY_CARE_PROVIDER_SITE_OTHER)
Admission: EM | Admit: 2013-10-18 | Discharge: 2013-10-18 | Disposition: A | Discharge status: Home / Self Care | Payer: 59 | Source: Home / Self Care | Attending: Emergency Medicine | Admitting: Emergency Medicine

## 2013-10-18 ENCOUNTER — Encounter (HOSPITAL_COMMUNITY): Payer: Self-pay | Admitting: Emergency Medicine

## 2013-10-18 DIAGNOSIS — S134XXA Sprain of ligaments of cervical spine, initial encounter: Secondary | ICD-10-CM

## 2013-10-18 DIAGNOSIS — S139XXA Sprain of joints and ligaments of unspecified parts of neck, initial encounter: Secondary | ICD-10-CM

## 2013-10-18 MED ORDER — CYCLOBENZAPRINE HCL 5 MG PO TABS: 5.0000 mg | ORAL_TABLET | Freq: Three times a day (TID) | ORAL | Status: DC | PRN

## 2013-10-18 MED ORDER — MELOXICAM 15 MG PO TABS: 15.0000 mg | ORAL_TABLET | Freq: Every day | ORAL | Status: DC
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
## 2014-01-08 ENCOUNTER — Other Ambulatory Visit: Payer: Self-pay

## 2014-02-17 ENCOUNTER — Encounter: Payer: Self-pay | Admitting: Family Medicine

## 2014-02-17 ENCOUNTER — Ambulatory Visit (INDEPENDENT_AMBULATORY_CARE_PROVIDER_SITE_OTHER): Payer: 59 | Admitting: Family Medicine

## 2014-02-17 VITALS — BP 120/71 | HR 101 | Temp 98.6°F | Wt 176.0 lb

## 2014-02-17 DIAGNOSIS — J209 Acute bronchitis, unspecified: Secondary | ICD-10-CM

## 2014-02-17 DIAGNOSIS — Z0000 Encounter for general adult medical examination without abnormal findings: Secondary | ICD-10-CM

## 2014-02-17 DIAGNOSIS — Z3041 Encounter for surveillance of contraceptive pills: Secondary | ICD-10-CM

## 2014-02-17 DIAGNOSIS — J45990 Exercise induced bronchospasm: Secondary | ICD-10-CM

## 2014-02-17 DIAGNOSIS — Z113 Encounter for screening for infections with a predominantly sexual mode of transmission: Secondary | ICD-10-CM

## 2014-02-17 DIAGNOSIS — D1803 Hemangioma of intra-abdominal structures: Secondary | ICD-10-CM

## 2014-02-17 MED ORDER — HYDROCODONE-HOMATROPINE 5-1.5 MG/5ML PO SYRP: 5.0000 mL | ORAL_SOLUTION | ORAL | Status: DC | PRN

## 2014-02-17 MED ORDER — AZITHROMYCIN 250 MG PO TABS: ORAL_TABLET | ORAL | Status: DC

## 2014-02-17 MED ORDER — ALBUTEROL SULFATE HFA 108 (90 BASE) MCG/ACT IN AERS: 2.0000 | INHALATION_SPRAY | RESPIRATORY_TRACT | Status: DC | PRN

## 2014-02-17 NOTE — Progress Notes (Signed)
Pre visit review using our clinic review tool, if applicable. No additional management support is needed unless otherwise documented below in the visit note. 

## 2014-02-17 NOTE — Progress Notes (Signed)
   Subjective:    Patient ID: Kathleen Hodges, female    DOB: Jul 28, 1990, 23 y.o.   MRN: [id number]  HPI Here on break from college for 2 weeks of chest tightness and coughing up yellow sputum. She occasionally wheezes. No fever or chest pain. 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    Review of Systems  Constitutional: Negative.   HENT: Negative.   Eyes: Negative.   Respiratory: Positive for cough, chest tightness, shortness of breath and wheezing.        Objective:   Physical Exam  Constitutional: She appears well-developed and well-nourished.  HENT:  Right Ear: External ear normal.  Left Ear: External ear normal.  Nose: Nose normal.  Mouth/Throat: Oropharynx is clear and moist.  Eyes: Conjunctivae are normal.  Pulmonary/Chest: Effort normal. No respiratory distress. She has no wheezes. She has no rales.  scattered rhonchi   Lymphadenopathy:    She has no cervical adenopathy.          Assessment & Plan:  Given a Zpack and a fresh inhaler to use prn

## 2014-08-08 IMAGING — US US ABDOMEN COMPLETE
1 series · 14 of 25 positions shown · non-contrast
Comparison: 06/26/2011.

CLINICAL DATA: Abdominal pain.  Follow-up hemangioma.

COMPLETE ABDOMINAL ULTRASOUND

[Series 1: us abdomen complete · 0.20mm/px · 14 of 71 slices shown]
[im 1/71]
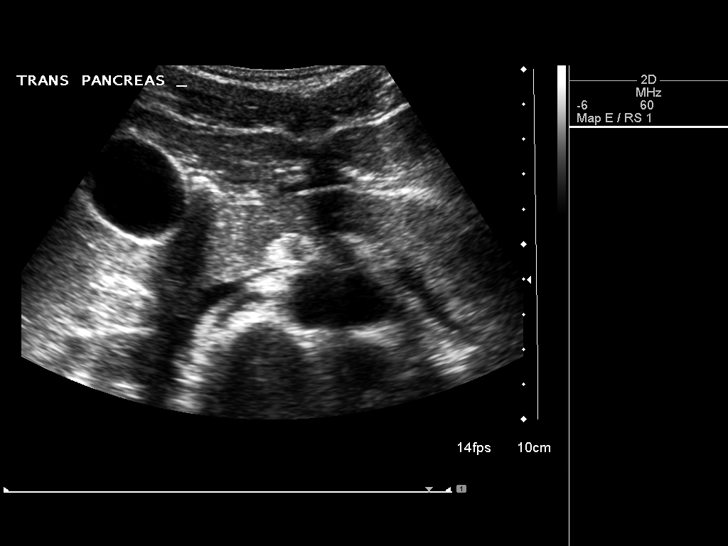
[im 6/71]
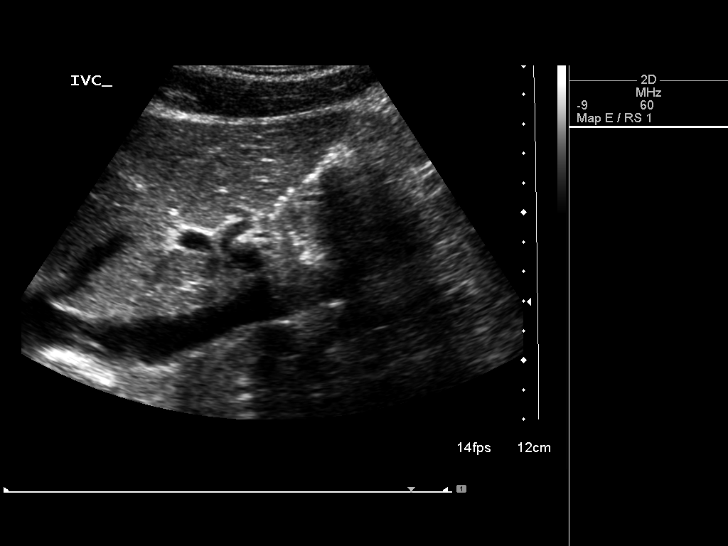
[im 12/71]
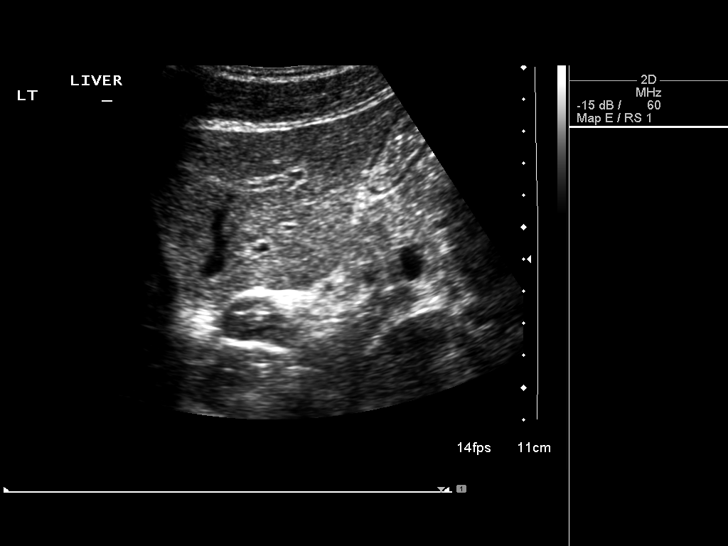
[im 18/71]
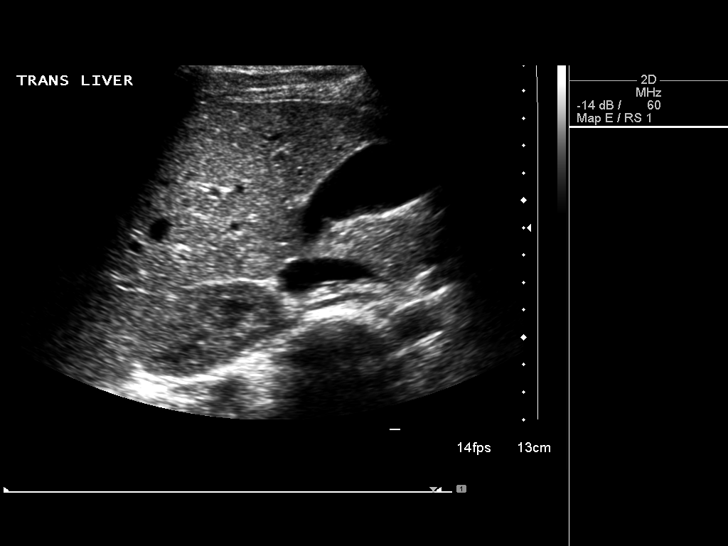
[im 24/71]
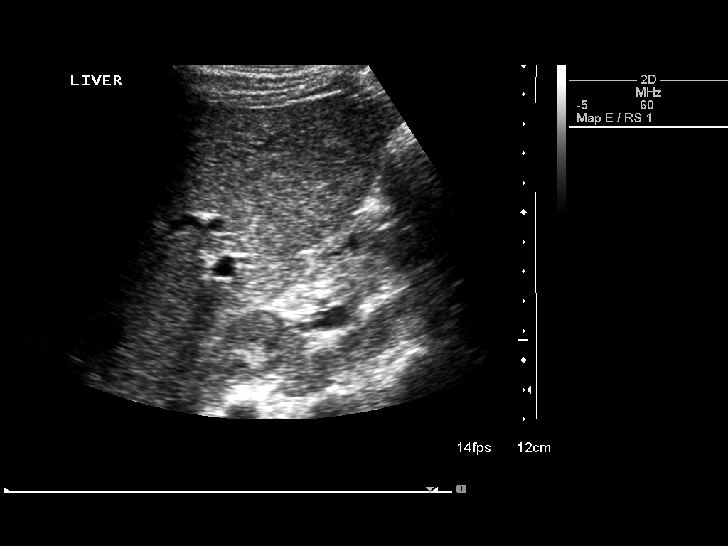
[im 27/71]
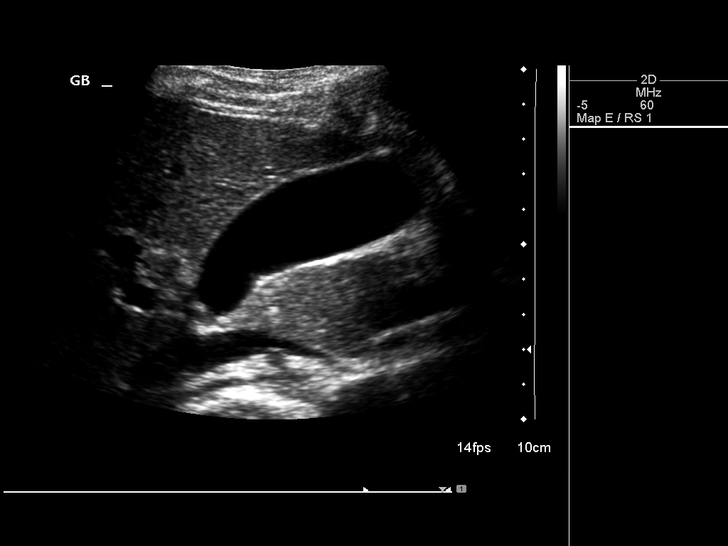
[im 33/71]
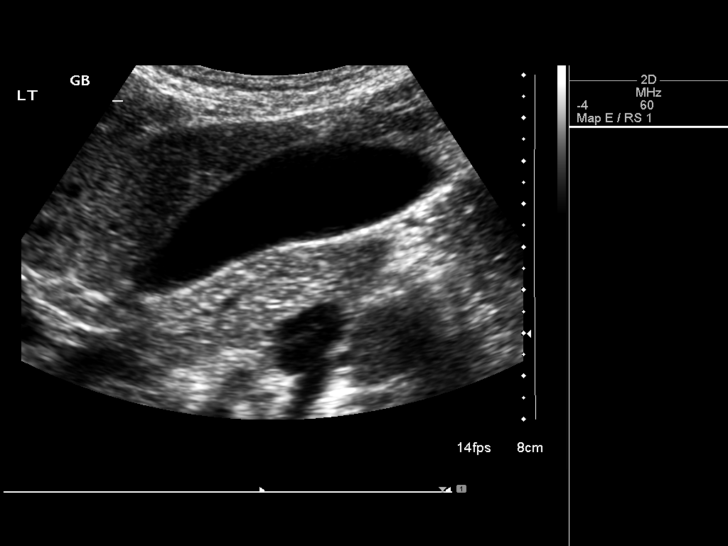
[im 38/71]
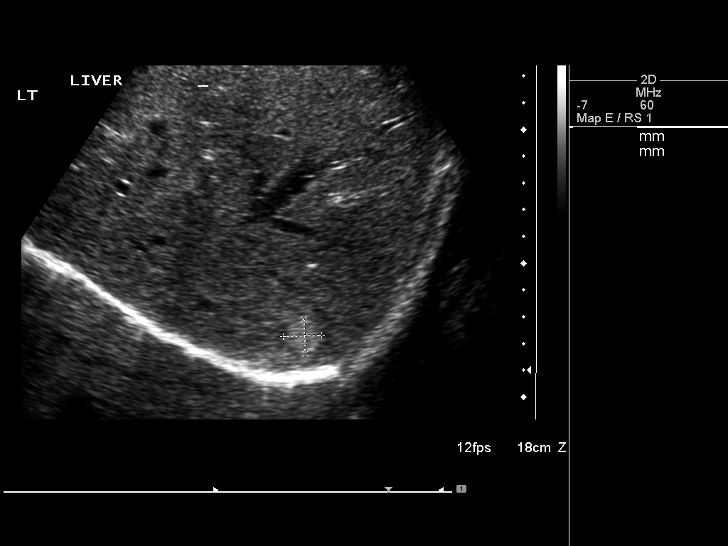
[im 44/71]
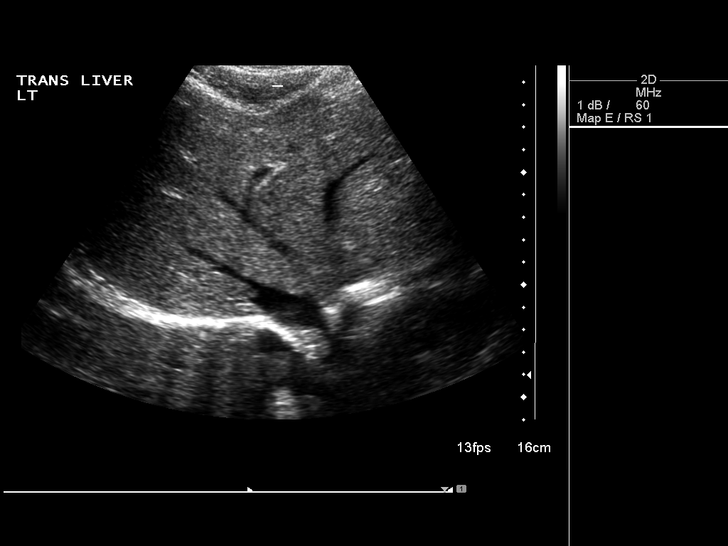
[im 47/71]
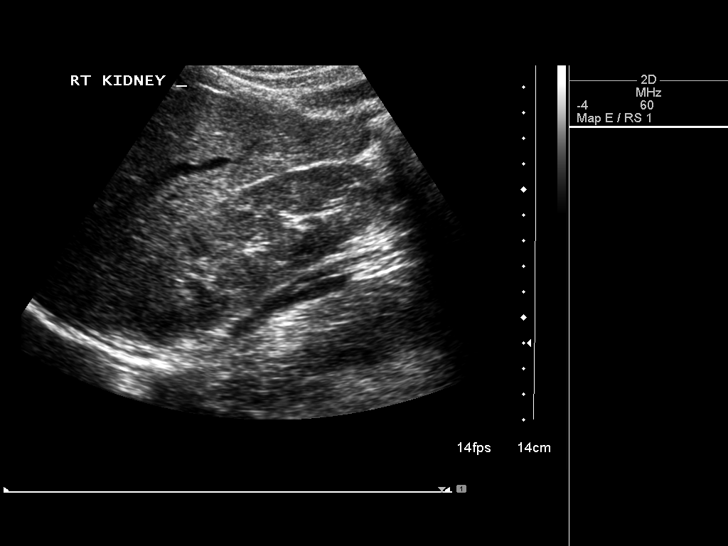
[im 53/71]
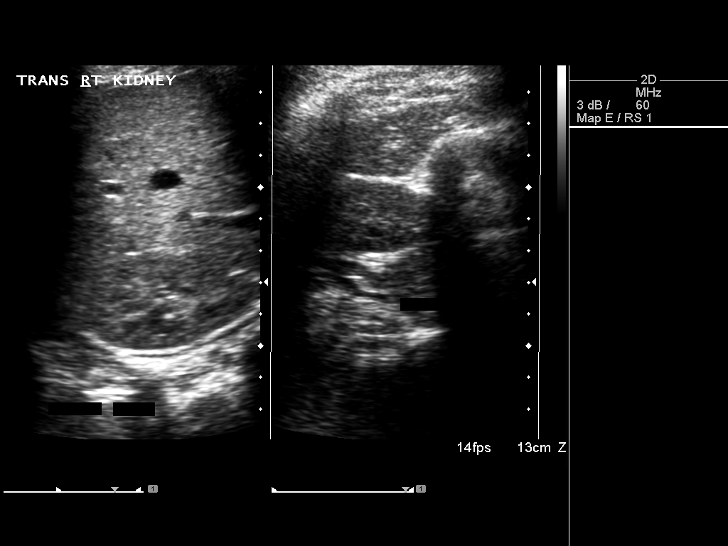
[im 59/71]
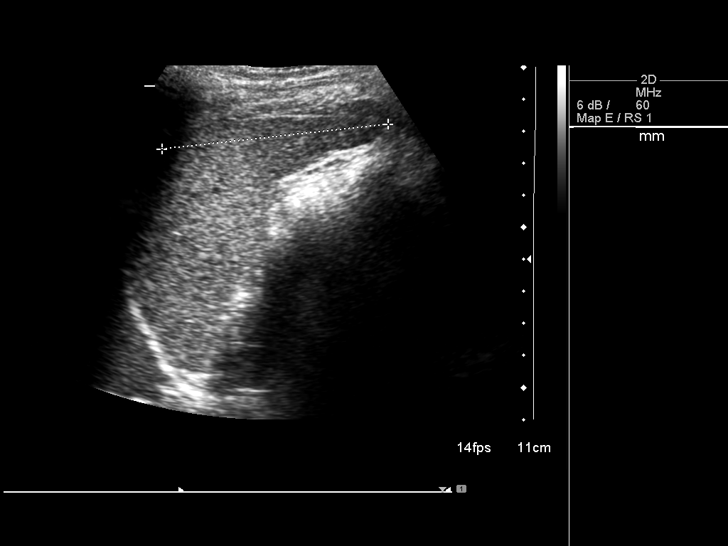
[im 65/71]
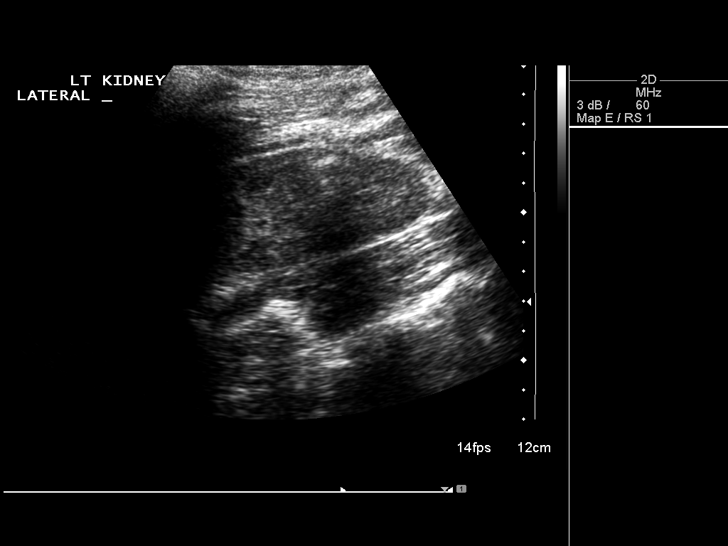
[im 71/71]
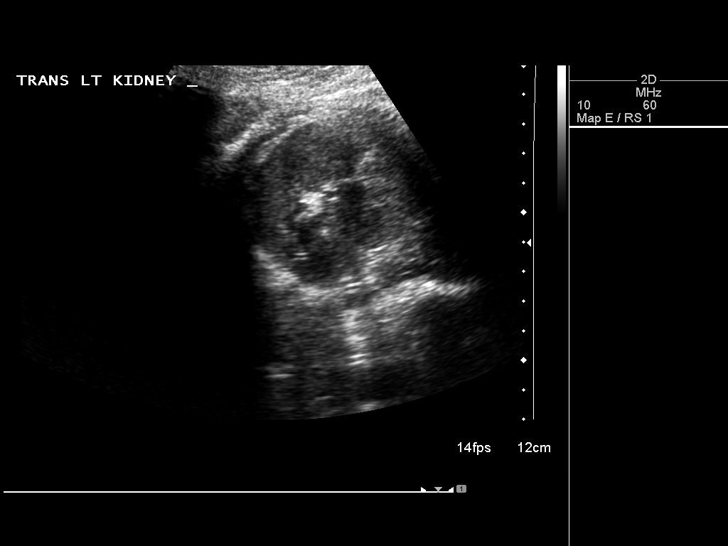

[14 of 25 positions shown; findings below may reference images not displayed]

FINDINGS: Gallbladder:  No gallstones, gallbladder wall thickening, or
pericholecystic fluid.

Common bile duct:  Measures 2 mm, within normal limits.

Liver:  A hyperechoic lesion in the right hepatic lobe measures
x 1.3 x 1.6 cm, stable and most consistent with a hemangioma.
Within normal limits in parenchymal echogenicity.

IVC:  Appears normal.

Pancreas:  No focal abnormality seen.

Spleen:  Measures 7.1 cm, negative.

Right Kidney:  Measures 12.0 cm, negative.

Left Kidney:  Measures 11.1 cm, negative.

Abdominal aorta:  No aneurysm identified.
IMPRESSION: 1.  No acute findings.
2.  Stable hyperechoic lesion in the right hepatic lobe, most
consistent with a hemangioma.

## 2014-09-06 ENCOUNTER — Telehealth: Payer: Self-pay | Admitting: Internal Medicine

## 2014-09-06 NOTE — Telephone Encounter (Signed)
Pt is home for the summer from college and needs cpe anytime after July 29, is it ok to work in?

## 2014-09-06 NOTE — Telephone Encounter (Signed)
yes

## 2014-09-09 NOTE — Telephone Encounter (Signed)
Pt has been scheduled.  °

## 2014-10-16 ENCOUNTER — Other Ambulatory Visit: Payer: Self-pay | Admitting: Internal Medicine

## 2014-10-18 NOTE — Telephone Encounter (Signed)
Sent to the pharmacy by e-scribe.  Pt has appt on 10/28/14 for CPX

## 2014-10-28 ENCOUNTER — Ambulatory Visit (INDEPENDENT_AMBULATORY_CARE_PROVIDER_SITE_OTHER): Payer: 59 | Admitting: Internal Medicine

## 2014-10-28 ENCOUNTER — Encounter: Payer: Self-pay | Admitting: Internal Medicine

## 2014-10-28 ENCOUNTER — Other Ambulatory Visit (HOSPITAL_COMMUNITY)
Admission: RE | Admit: 2014-10-28 | Discharge: 2014-10-28 | Disposition: A | Discharge status: Home / Self Care | Payer: 59 | Source: Ambulatory Visit | Attending: Internal Medicine | Admitting: Internal Medicine

## 2014-10-28 VITALS — BP 122/70 | Temp 98.5°F | Ht 70.5 in | Wt 176.4 lb

## 2014-10-28 DIAGNOSIS — Z3041 Encounter for surveillance of contraceptive pills: Secondary | ICD-10-CM

## 2014-10-28 DIAGNOSIS — Z01419 Encounter for gynecological examination (general) (routine) without abnormal findings: Secondary | ICD-10-CM | POA: Diagnosis not present

## 2014-10-28 DIAGNOSIS — Z23 Encounter for immunization: Secondary | ICD-10-CM

## 2014-10-28 DIAGNOSIS — Z0000 Encounter for general adult medical examination without abnormal findings: Secondary | ICD-10-CM

## 2014-10-28 DIAGNOSIS — Z113 Encounter for screening for infections with a predominantly sexual mode of transmission: Secondary | ICD-10-CM | POA: Diagnosis present

## 2014-10-28 DIAGNOSIS — Z01411 Encounter for gynecological examination (general) (routine) with abnormal findings: Secondary | ICD-10-CM | POA: Diagnosis not present

## 2014-10-28 DIAGNOSIS — N921 Excessive and frequent menstruation with irregular cycle: Secondary | ICD-10-CM

## 2014-10-28 MED ORDER — NORETHIN ACE-ETH ESTRAD-FE 1.5-30 MG-MCG PO TABS: 1.0000 | ORAL_TABLET | Freq: Every day | ORAL | Status: DC

## 2014-10-28 MED ORDER — SELENIUM SULFIDE 2.5 % EX LOTN: TOPICAL_LOTION | CUTANEOUS | Status: AC

## 2014-10-28 NOTE — Patient Instructions (Signed)
Will notify you when pap results are available. If ok then repeat in 2-3 years.  Will change formulation of ocps and monitor bleeding pattern.   Weill refill the selsun lotion.   Healthy lifestyle includes : At least 150 minutes of exercise weeks  , weight at healthy levels, which is usually   BMI 19-25. Avoid trans fats and processed foods;  Increase fresh fruits and veges to 5 servings per day. And avoid sweet beverages including tea and juice. Mediterranean diet with olive oil and nuts have been noted to be heart and brain healthy . Avoid tobacco products . Limit  alcohol to  7 per week for women and 14 servings for men.  Get adequate sleep . Wear seat belts . Don't text and drive .  Your cholesterol was 194 last year . Check up in a year or as needed

## 2014-10-28 NOTE — Progress Notes (Signed)
Pre visit review using our clinic review tool, if applicable. No additional management support is needed unless otherwise documented below in the visit note.  Chief Complaint  Patient presents with  . Annual Exam    HPI: Patient  Kathleen Hodges  24 y.o. comes in today for Preventive Health Care visit    Since her last visit her health has not changed except she did have an injury playing basketball fx 3rd metarsal and healed February . No symptoms now but hasn't had a recheck by her sports team. Stopped the OCPs a few months ago because she couldn't remember to take one time he was having breakthrough bleeding. When taking them regularly her. Would began on the third week of active pills but actually she felt better on them and would like to go back on them. Currently no relationship last IC in March or thereabouts. No vaginal discharge UTI symptoms. 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 Would like a refill of the medicine that helped her skin rash Selsun lotion   Health Maintenance  Topic Date Due  . INFLUENZA VACCINE  10/25/2014  . TETANUS/TDAP  10/27/2024  . HIV Screening  Completed   Health Maintenance Review LIFESTYLE:  Exercise:  Basketball Tobacco/ETS: No Alcohol: per day occasional Sugar beverages: No Sleep: Good Drug use: no Lmp: normal stopped   ocps kept slipp[ing  Would  Like to go back on. Has never had a Pap smear at the end of her period now ROS:  GEN/ HEENT: No fever, significant weight changes sweats headaches vision problems hearing changes, CV/ PULM; No chest pain shortness of breath cough, syncope,edema  change in exercise tolerance. GI /GU: No adominal pain, vomiting, change in bowel habits. No blood in the stool. No significant GU symptoms. SKIN/HEME: ,no acute skin rashes suspicious lesions or bleeding. No lymphadenopathy, nodules, masses.  NEURO/ PSYCH:  No neurologic signs such as weakness numbness. No depression anxiety. IMM/ Allergy:  No unusual infections.  Allergy .   REST of 12 system review negative except as per HPI   Past Medical History  Diagnosis Date  . OM (otitis media)     hx  . Abdominal pain, recurrent 06/20/2011  . SHIN SPLINTS 04/21/2009    Qualifier: Diagnosis of  By: Regis Bill MD, Standley Brooking     Past Surgical History  Procedure Laterality Date  . Tympanostomy      age 42  . Anterior cruciate ligament repair Left     murphy wainer 2015    Family History  Problem Relation Age of Onset  . Asthma Mother   . Hyperlipidemia Father   . Obesity Sister   . Asthma Brother     History   Social History  . Marital Status: Single    Spouse Name: N/A  . Number of Children: N/A  . Years of Education: N/A   Social History Main Topics  . Smoking status: Passive Smoke Exposure - Never Smoker  . Smokeless tobacco: Never Used  . Alcohol Use: No  . Drug Use: No  . Sexual Activity: Not on file   Other Topics Concern  . None   Social History Narrative   HH of 5   Born in [place]   Father smokes not in house   Pet dog   GDS  Graduated    Basket ball and  Track      [employer] on [employer]  Outpatient Prescriptions Prior to Visit  Medication Sig Dispense Refill  . albuterol (VENTOLIN HFA) 108 (90 BASE) MCG/ACT inhaler Inhale 2 puffs into the lungs every 4 (four) hours as needed for wheezing or shortness of breath. 2 puffs pre exercise  1 hours 1 Inhaler 2  . azithromycin (ZITHROMAX) 250 MG tablet As directed 6 tablet 0  . cyclobenzaprine (FLEXERIL) 5 MG tablet Take 1 tablet (5 mg total) by mouth 3 (three) times daily as needed for muscle spasms. (Patient not taking: Reported on 02/17/2014) 30 tablet 0  . HYDROcodone-homatropine (HYDROMET) 5-1.5 MG/5ML syrup Take 5 mLs by mouth every 4 (four) hours as needed. 240 mL 0  . JUNEL FE 1/20 1-20 MG-MCG tablet TAKE 1 TABLET EVERY DAY 84 tablet 0  . meloxicam (MOBIC) 15 MG tablet Take 1 tablet (15 mg total) by  mouth daily. (Patient not taking: Reported on 02/17/2014) 30 tablet 0   No facility-administered medications prior to visit.     EXAM:  BP 122/70 mmHg  Temp(Src) 98.5 F (36.9 C) (Oral)  Ht 5' 10.5" (1.791 m)  Wt 176 lb 6.4 oz (80.015 kg)  BMI 24.94 kg/m2  LMP 10/25/2014  Body mass index is 24.94 kg/(m^2).  Physical Exam: Vital signs reviewed NIO:EVOJ is a well-developed well-nourished alert cooperative    who appearsr stated age in no acute distress.  HEENT: normocephalic atraumatic , Eyes: PERRL EOM's full, conjunctiva clear, Nares: paten,t no deformity discharge or tenderness., Ears: no deformity EAC's clear TMs with normal landmarks. Mouth: clear OP, no lesions, edema.  Moist mucous membranes. Dentition in adequate repair. NECK: supple without masses, thyromegaly or bruits. CHEST/PULM:  Clear to auscultation and percussion breath sounds equal no wheeze , rales or rhonchi. No chest wall deformities or tenderness. Breasts no nodules or discharge she has bilateral pierced nipples. CV: PMI is nondisplaced, S1 S2 no gallops, murmurs, rubs. Peripheral pulses are full without delay.No JVD .  ABDOMEN: Bowel sounds normal nontender  No guard or rebound, no hepato splenomegal no CVA tenderness.  No hernia. Extremtities:  No clubbing cyanosis or edema, no acute joint swelling or redness no focal atrophy NEURO:  Oriented x3, cranial nerves 3-12 appear to be intact, no obvious focal weakness,gait within normal limits no abnormal reflexes or asymmetrical SKIN: No acute rashes normal turgor, color, no bruising or petechiae. Faint hyperpigmentation upper arms. PSYCH: Oriented, good eye contact, no obvious depression anxiety, cognition and judgment appear normal. LN: no cervical axillary inguinal adenopathy Pelvic: NL ext GU, labia clear without lesions or rash . Vagina no lesions .Cervix: clear  UTERUS: Neg CMT Adnexa:  clear no masses . PAP done [facility] chlamydia screen   Lab Results  Component  Value Date   WBC 3.3* 10/07/2013   HGB 13.2 10/07/2013   HCT 39.7 10/07/2013   PLT 277.0 10/07/2013   GLUCOSE 90 10/07/2013   CHOL 194 10/07/2013   TRIG 36.0 10/07/2013   HDL 53.20 10/07/2013   LDLDIRECT 144.3 08/22/2012   LDLCALC 134* 10/07/2013   ALT 16 10/07/2013   AST 24 10/07/2013   NA 140 10/07/2013   K 3.7 10/07/2013   CL 109 10/07/2013   CREATININE 1.0 10/07/2013   BUN 10 10/07/2013   CO2 25 10/07/2013   TSH 0.61 10/07/2013    ASSESSMENT AND PLAN:  Discussed the following assessment and plan:  Visit for preventive health examination - Plan: PAP [[place]]  Encounter for routine gynecological examination - STI screen - Plan: PAP [[place]]  Need for  tetanus booster - Plan: Td vaccine greater than or equal to 7yo preservative free IM, CANCELED: Tdap vaccine greater than or equal to 7yo IM  Oral contraceptive use  - Reinitiation of medication discussed breakthrough bleeding increased 1:30 pills  Breakthrough bleeding on OCPs - By history late on the 1/20s Change restart pills to Loestrin 1:30. Expectant management. We'll let her know when pap is back. Counseled healthy lifestyle prevention. No need for lab work today. Low risk. She can get back with [place] if needed before next years checkup. Patient Care Team: Burnis Medin, MD as PCP - General Patient Instructions  Will notify you when pap results are available. If ok then repeat in 2-3 years.  Will change formulation of ocps and monitor bleeding pattern.   Weill refill the selsun lotion.   Healthy lifestyle includes : At least 150 minutes of exercise weeks  , weight at healthy levels, which is usually   BMI 19-25. Avoid trans fats and processed foods;  Increase fresh fruits and veges to 5 servings per day. And avoid sweet beverages including tea and juice. Mediterranean diet with olive oil and nuts have been noted to be heart and brain healthy . Avoid tobacco products . Limit  alcohol to  7 per week for  women and 14 servings for men.  Get adequate sleep . Wear seat belts . Don't text and drive .  Your cholesterol was 194 last year . Check up in a year or as needed      Standley Brooking. Panosh M.D.

## 2014-10-29 ENCOUNTER — Encounter: Payer: Self-pay | Admitting: Internal Medicine

## 2014-10-29 DIAGNOSIS — R87612 Low grade squamous intraepithelial lesion on cytologic smear of cervix (LGSIL): Secondary | ICD-10-CM | POA: Insufficient documentation

## 2014-10-29 LAB — CYTOLOGY - PAP

## 2014-11-12 ENCOUNTER — Encounter: Payer: 59 | Admitting: Sports Medicine

## 2015-05-04 ENCOUNTER — Other Ambulatory Visit: Payer: Self-pay | Admitting: Family Medicine

## 2015-05-04 MED ORDER — NORETHIN ACE-ETH ESTRAD-FE 1.5-30 MG-MCG PO TABS: 1.0000 | ORAL_TABLET | Freq: Every day | ORAL | Status: DC

## 2015-05-04 NOTE — Telephone Encounter (Signed)
Sent to the pharmacy by e-scribe.  Filled for 1 year 10/2014 and sent to local pharmacy.

## 2015-11-01 NOTE — Progress Notes (Signed)
Pre visit review using our clinic review tool, if applicable. No additional management support is needed unless otherwise documented below in the visit note.  Chief Complaint  Patient presents with  . Annual Exam    HPI: Patient  Kathleen Hodges  25 y.o. comes in today for Preventive Health Care visit  Now going to grad school  [employer].  Periods  normal went off ocps in [place] cause saw report about pat having a stroke on ocps but she smoked uncertain about risk benefit  Uses condoms   . No sx   Health Maintenance  Topic Date Due  . INFLUENZA VACCINE  10/25/2015  . TETANUS/TDAP  10/27/2024  . HIV Screening  Completed   Health Maintenance Review LIFESTYLE:  Exercise:  Basketball stillplays college level recovered from her acl surgery left  Tobacco/ETS:n Alcohol: ocass Sugar beverages:ocass sweet tea Sleep:7+ hours Drug use: no\neg depr anx     ROS:  GEN/ HEENT: No fever, significant weight changes sweats headaches vision problems hearing changes, CV/ PULM; No chest pain shortness of breath cough, syncope,edema  change in exercise tolerance. GI /GU: No adominal pain, vomiting, change in bowel habits. No blood in the stool. No significant GU symptoms. SKIN/HEME: ,no acute skin rashes suspicious lesions or bleeding. No lymphadenopathy, nodules, masses.  NEURO/ PSYCH:  No neurologic signs such as weakness numbness. No depression anxiety. IMM/ Allergy: No unusual infections.  Allergy .   REST of 12 system review negative except as per HPI   Past Medical History:  Diagnosis Date  . Abdominal pain, recurrent 06/20/2011  . Fracture of metatarsal bone of right foot    Third 2016 basketball  . OM (otitis media)    hx  . SHIN SPLINTS 04/21/2009   Qualifier: Diagnosis of  By: Regis Bill MD, Standley Brooking     Past Surgical History:  Procedure Laterality Date  . ANTERIOR CRUCIATE LIGAMENT REPAIR Left    murphy wainer 2015  . TYMPANOSTOMY     age 61    Family History    Problem Relation Age of Onset  . Asthma Mother   . Hyperlipidemia Father   . Obesity Sister   . Asthma Brother     Social History   Social History  . Marital status: Single    Spouse name: N/A  . Number of children: N/A  . Years of education: N/A   Social History Main Topics  . Smoking status: Passive Smoke Exposure - Never Smoker  . Smokeless tobacco: Never Used  . Alcohol use No  . Drug use: No  . Sexual activity: Not Asked   Other Topics Concern  . None   Social History Narrative   HH of 5   Born in [place]   Father smokes not in house   Pet dog   GDS  Graduated    Basket ball and  [employer]      [employer] site now to [employer]                Outpatient Medications Prior to Visit  Medication Sig Dispense Refill  . albuterol (VENTOLIN HFA) 108 (90 BASE) MCG/ACT inhaler Inhale 2 puffs into the lungs every 4 (four) hours as needed for wheezing or shortness of breath. 2 puffs pre exercise  1 hours 1 Inhaler 2  . selenium sulfide (SELSUN) 2.5 % shampoo APPLY TO TRUNK & LEAVE ON OVER NIGHT. [place] OFF  THE NEXT DAY. USE FOR 1 WEEK, THEN PERIODICALLY 120 mL 1  . norethindrone-ethinyl estradiol-iron (MICROGESTIN FE,GILDESS FE,LOESTRIN FE) 1.5-30 MG-MCG tablet Take 1 tablet by mouth daily. 3 Package 1   No facility-administered medications prior to visit.      EXAM:  BP 122/82 (BP Location: Right Arm, Patient Position: Sitting, Cuff Size: Normal)   Temp 99.6 F (37.6 C) (Oral)   Ht 5' 10.5" (1.791 m)   Wt 177 lb 4.8 oz (80.4 kg)   BMI 25.08 kg/m   Body mass index is 25.08 kg/m.  Physical Exam: Vital signs reviewed UQJ:FHLK is a well-developed well-nourished alert cooperative    who appearsr stated age in no acute distress.  HEENT: normocephalic atraumatic , Eyes: PERRL EOM's full, conjunctiva clear, Nares: paten,t no deformity discharge or tenderness., Ears: no deformity EAC's clear  TMs with normal landmarks. Mouth: clear OP, no lesions, edema.  Moist mucous membranes. Dentition in adequate repair. NECK: supple without masses, thyromegaly or bruits. CHEST/PULM:  Clear to auscultation and percussion breath sounds equal no wheeze , rales or rhonchi. No chest wall deformities or tenderness.Breast: normal by inspection . No dimpling, discharge, masses, tenderness or discharge . CV: PMI is nondisplaced, S1 S2 no gallops, murmurs, rubs. Peripheral pulses are full without delay.No JVD . Breast: normal by inspection . No dimpling, discharge, masses, tenderness or discharge . ABDOMEN: Bowel sounds normal nontender  No guard or rebound, no hepato splenomegal no CVA tenderness.  No hernia. Extremtities:  No clubbing cyanosis or edema, no acute joint swelling or redness no focal atrophy NEURO:  Oriented x3, cranial nerves 3-12 appear to be intact, no obvious focal weakness,gait within normal limits no abnormal reflexes or asymmetrical SKIN: No acute rashes normal turgor, color, no bruising or petechiae. PSYCH: Oriented, good eye contact, no obvious depression anxiety, cognition and judgment appear normal. LN: no cervical axillary inguinal adenopathy  Pelvic: NL ext GU, labia clear without lesions or rash . Vagina no lesions .Cervix: clear?  Non smooth area flat  About 3 2-3 mm  at oss non palpable and normal color ? Ectopy vs  Warty   UTERUS: Neg CMT Adnexa:  clear no masses . PAP done with [facility] screen    ASSESSMENT AND PLAN:  Discussed the following assessment and plan:  Visit for preventive health examination - Plan: PAP [[name]]  Oral contraceptive use  - risk benefit disc will go back on for now consider mirena skkylar etc  Pap smear abnormality of cervix with LGSIL - repeat today disc gyne referral if persists and expectant management - Plan: PAP [[name]]  Encounter for routine gynecological examination - Plan: PAP [] Form signed with immunizations   Uncertain if I am seeing ectopy or poss wart flat at os  But no mass effect Patient Care Team: Burnis Medin, MD as PCP - General (Internal Medicine) Patient Instructions  Continue lifestyle intervention healthy eating and exercise . Will let  You know pap results when available   If abnormal  Then plan gyne referral as discussed   Restart ocps as planned   Benefit more than risk  At this time .  Good luck in [place] school .    Health Maintenance, Female Adopting a healthy lifestyle and getting preventive care can go a long way to promote health and wellness. Talk with your health care provider about what schedule of regular examinations is right for you. This is a good chance for you to check in  with your provider about disease prevention and staying healthy. In between checkups, there are plenty of things you can do on your own. Experts have done a lot of research about which lifestyle changes and preventive measures are most likely to keep you healthy. Ask your health care provider for more information. WEIGHT AND DIET  Eat a healthy diet  Be sure to include plenty of vegetables, fruits, low-fat dairy products, and lean protein.  Do not eat a lot of foods high in solid fats, added sugars, or salt.  Get regular exercise. This is one of the most important things you can do for your health.  Most adults should exercise for at least 150 minutes each week. The exercise should increase your heart rate and make you sweat (moderate-intensity exercise).  Most adults should also do strengthening exercises at least twice a week. This is in addition to the moderate-intensity exercise.  Maintain a healthy weight  Body mass index (BMI) is a measurement that can be used to identify possible weight problems. It estimates body fat based on height and weight. Your health care provider can help determine your BMI and help you achieve or maintain a healthy weight.  For females 53 years of age and  older:   A BMI below 18.5 is considered underweight.  A BMI of 18.5 to 24.9 is normal.  A BMI of 25 to 29.9 is considered overweight.  A BMI of 30 and above is considered obese.  Watch levels of cholesterol and blood lipids  You should start having your blood tested for lipids and cholesterol at 23 years of age, then have this test every 5 years.  You may need to have your cholesterol levels checked more often if:  Your lipid or cholesterol levels are high.  You are older than 23 years of age.  You are at high risk for heart disease.  CANCER SCREENING   Lung Cancer  Lung cancer screening is recommended for adults 28-76 years old who are at high risk for lung cancer because of a history of smoking.  A yearly low-dose CT scan of the lungs is recommended for people who:  Currently smoke.  Have quit within the past 15 years.  Have at least a 30-pack-year history of smoking. A pack year is smoking an average of one pack of cigarettes a day for 1 year.  Yearly screening should continue until it has been 15 years since you quit.  Yearly screening should stop if you develop a health problem that would prevent you from having lung cancer treatment.  Breast Cancer  Practice breast self-awareness. This means understanding how your breasts normally appear and feel.  It also means doing regular breast self-exams. Let your health care provider know about any changes, no matter how small.  If you are in your 20s or 30s, you should have a clinical breast exam (CBE) by a health care provider every 1-3 years as part of a regular health exam.  If you are 72 or older, have a CBE every year. Also consider having a breast X-ray (mammogram) every year.  If you have a family history of breast cancer, talk to your health care provider about genetic screening.  If you are at high risk for breast cancer, talk to your health care provider about having an MRI and a mammogram every  year.  Breast cancer gene (BRCA) assessment is recommended for women who have family members with BRCA-related cancers. BRCA-related cancers include:  Breast.  Ovarian.  Tubal.  Peritoneal cancers.  Results of the assessment will determine the need for genetic counseling and BRCA1 and BRCA2 testing. Cervical Cancer Your health care provider may recommend that you be screened regularly for cancer of the pelvic organs (ovaries, uterus, and vagina). This screening involves a pelvic examination, including checking for microscopic changes to the surface of your cervix (Pap test). You may be encouraged to have this screening done every 3 years, beginning at age 71.  For women ages 44-65, health care providers may recommend pelvic exams and Pap testing every 3 years, or they may recommend the Pap and pelvic exam, combined with testing for human papilloma virus (HPV), every 5 years. Some types of HPV increase your risk of cervical cancer. Testing for HPV may also be done on women of any age with unclear Pap test results.  Other health care providers may not recommend any screening for nonpregnant women who are considered low risk for pelvic cancer and who do not have symptoms. Ask your health care provider if a screening pelvic exam is right for you.  If you have had past treatment for cervical cancer or a condition that could lead to cancer, you need Pap tests and screening for cancer for at least 20 years after your treatment. If Pap tests have been discontinued, your risk factors (such as having a new sexual partner) need to be reassessed to determine if screening should resume. Some women have medical problems that increase the chance of getting cervical cancer. In these cases, your health care provider may recommend more frequent screening and Pap tests. Colorectal Cancer  This type of cancer can be detected and often prevented.  Routine colorectal cancer screening usually begins at 23 years of  age and continues through 23 years of age.  Your health care provider may recommend screening at an earlier age if you have risk factors for colon cancer.  Your health care provider may also recommend using home test kits to check for hidden blood in the stool.  A small camera at the end of a tube can be used to examine your colon directly (sigmoidoscopy or colonoscopy). This is done to check for the earliest forms of colorectal cancer.  Routine screening usually begins at age 40.  Direct examination of the colon should be repeated every 5-10 years through 23 years of age. However, you may need to be screened more often if early forms of precancerous polyps or small growths are found. Skin Cancer  Check your skin from head to toe regularly.  Tell your health care provider about any new moles or changes in moles, especially if there is a change in a mole's shape or color.  Also tell your health care provider if you have a mole that is larger than the size of a pencil eraser.  Always use sunscreen. Apply sunscreen liberally and repeatedly throughout the day.  Protect yourself by wearing long sleeves, pants, a wide-brimmed hat, and sunglasses whenever you are outside. HEART DISEASE, DIABETES, AND HIGH BLOOD PRESSURE   High blood pressure causes heart disease and increases the risk of stroke. High blood pressure is more likely to develop in:  People who have blood pressure in the high end of the normal range (130-139/85-89 mm Hg).  People who are overweight or obese.  People who are African American.  If you are 33-25 years of age, have your blood pressure checked every 3-5 years. If you are 51 years of age or older, have your blood  pressure checked every year. You should have your blood pressure measured twice--once when you are at a hospital or clinic, and once when you are not at a hospital or clinic. Record the average of the two measurements. To check your blood pressure when you are  not at a hospital or clinic, you can use:  An automated blood pressure machine at a pharmacy.  A home blood pressure monitor.  If you are between 32 years and 74 years old, ask your health care provider if you should take aspirin to prevent strokes.  Have regular diabetes screenings. This involves taking a blood sample to check your fasting blood sugar level.  If you are at a normal weight and have a low risk for diabetes, have this test once every three years after 23 years of age.  If you are overweight and have a high risk for diabetes, consider being tested at a younger age or more often. PREVENTING INFECTION  Hepatitis B  If you have a higher risk for hepatitis B, you should be screened for this virus. You are considered at high risk for hepatitis B if:  You were born in a country where hepatitis B is common. Ask your health care provider which countries are considered high risk.  Your parents were born in a high-risk country, and you have not been immunized against hepatitis B (hepatitis B vaccine).  You have HIV or AIDS.  You use needles to inject street drugs.  You live with someone who has hepatitis B.  You have had sex with someone who has hepatitis B.  You get hemodialysis treatment.  You take certain medicines for conditions, including cancer, organ transplantation, and autoimmune conditions. Hepatitis C  Blood testing is recommended for:  Everyone born from [birth year] through 1963.  Anyone with known risk factors for hepatitis C. Sexually transmitted infections (STIs)  You should be screened for sexually transmitted infections (STIs) including gonorrhea and chlamydia if:  You are sexually active and are younger than 23 years of age.  You are older than 23 years of age and your health care provider tells you that you are at risk for this type of infection.  Your sexual activity has changed since you were last screened and you are at an increased risk for  chlamydia or gonorrhea. Ask your health care provider if you are at risk.  If you do not have HIV, but are at risk, it may be recommended that you take a prescription medicine daily to prevent HIV infection. This is called pre-exposure prophylaxis (PrEP). You are considered at risk if:  You are sexually active and do not regularly use condoms or know the HIV status of your partner(s).  You take drugs by injection.  You are sexually active with a partner who has HIV. Talk with your health care provider about whether you are at high risk of being infected with HIV. If you choose to begin PrEP, you should first be tested for HIV. You should then be tested every 3 months for as long as you are taking PrEP.  PREGNANCY   If you are premenopausal and you may become pregnant, ask your health care provider about preconception counseling.  If you may become pregnant, take 400 to 800 micrograms (mcg) of folic acid every day.  If you want to prevent pregnancy, talk to your health care provider about birth control (contraception). OSTEOPOROSIS AND MENOPAUSE   Osteoporosis is a disease in which the bones lose minerals and strength with  aging. This can result in serious bone fractures. Your risk for osteoporosis can be identified using a bone density scan.  If you are 41 years of age or older, or if you are at risk for osteoporosis and fractures, ask your health care provider if you should be screened.  Ask your health care provider whether you should take a calcium or vitamin D supplement to lower your risk for osteoporosis.  Menopause may have certain physical symptoms and risks.  Hormone replacement therapy may reduce some of these symptoms and risks. Talk to your health care provider about whether hormone replacement therapy is right for you.  HOME CARE INSTRUCTIONS   Schedule regular health, dental, and eye exams.  Stay current with your immunizations.   Do not use any tobacco products  including cigarettes, chewing tobacco, or electronic cigarettes.  If you are pregnant, do not drink alcohol.  If you are breastfeeding, limit how much and how often you drink alcohol.  Limit alcohol intake to no more than 1 drink per day for nonpregnant women. One drink equals 12 ounces of beer, 5 ounces of wine, or 1 ounces of hard liquor.  Do not use street drugs.  Do not share needles.  Ask your health care provider for help if you need support or information about quitting drugs.  Tell your health care provider if you often feel depressed.  Tell your health care provider if you have ever been abused or do not feel safe at home.   This information is not intended to replace advice given to you by your health care provider. Make sure you discuss any questions you have with your health care provider.   Document Released: 09/25/2010 Document Revised: 04/02/2014 Document Reviewed: 02/11/2013 Elsevier Interactive Patient Education 2016 [place] K. Panosh M.D.

## 2015-11-02 ENCOUNTER — Other Ambulatory Visit (HOSPITAL_COMMUNITY)
Admission: RE | Admit: 2015-11-02 | Discharge: 2015-11-02 | Disposition: A | Discharge status: Home / Self Care | Payer: 59 | Source: Ambulatory Visit | Attending: Internal Medicine | Admitting: Internal Medicine

## 2015-11-02 ENCOUNTER — Encounter: Payer: Self-pay | Admitting: Internal Medicine

## 2015-11-02 ENCOUNTER — Ambulatory Visit (INDEPENDENT_AMBULATORY_CARE_PROVIDER_SITE_OTHER): Payer: 59 | Admitting: Internal Medicine

## 2015-11-02 VITALS — BP 122/82 | Temp 99.6°F | Ht 70.5 in | Wt 177.3 lb

## 2015-11-02 DIAGNOSIS — Z113 Encounter for screening for infections with a predominantly sexual mode of transmission: Secondary | ICD-10-CM | POA: Insufficient documentation

## 2015-11-02 DIAGNOSIS — Z01419 Encounter for gynecological examination (general) (routine) without abnormal findings: Secondary | ICD-10-CM

## 2015-11-02 DIAGNOSIS — Z0000 Encounter for general adult medical examination without abnormal findings: Secondary | ICD-10-CM

## 2015-11-02 DIAGNOSIS — Z01411 Encounter for gynecological examination (general) (routine) with abnormal findings: Secondary | ICD-10-CM | POA: Insufficient documentation

## 2015-11-02 DIAGNOSIS — R87612 Low grade squamous intraepithelial lesion on cytologic smear of cervix (LGSIL): Secondary | ICD-10-CM | POA: Diagnosis not present

## 2015-11-02 DIAGNOSIS — Z3041 Encounter for surveillance of contraceptive pills: Secondary | ICD-10-CM | POA: Diagnosis not present

## 2015-11-02 MED ORDER — NORETHIN ACE-ETH ESTRAD-FE 1.5-30 MG-MCG PO TABS: 1.0000 | ORAL_TABLET | Freq: Every day | ORAL | 3 refills | Status: DC

## 2015-11-02 NOTE — Patient Instructions (Signed)
Continue lifestyle intervention healthy eating and exercise . Will let  You know pap results when available   If abnormal  Then plan gyne referral as discussed   Restart ocps as planned   Benefit more than risk  At this time .  Good luck in [employer] .    Health Maintenance, Female Adopting a healthy lifestyle and getting preventive care can go a long way to promote health and wellness. Talk with your health care provider about what schedule of regular examinations is right for you. This is a good chance for you to check in with your provider about disease prevention and staying healthy. In between checkups, there are plenty of things you can do on your own. Experts have done a lot of research about which lifestyle changes and preventive measures are most likely to keep you healthy. Ask your health care provider for more information. WEIGHT AND DIET  Eat a healthy diet  Be sure to include plenty of vegetables, fruits, low-fat dairy products, and lean protein.  Do not eat a lot of foods high in solid fats, added sugars, or salt.  Get regular exercise. This is one of the most important things you can do for your health.  Most adults should exercise for at least 150 minutes each week. The exercise should increase your heart rate and make you sweat (moderate-intensity exercise).  Most adults should also do strengthening exercises at least twice a week. This is in addition to the moderate-intensity exercise.  Maintain a healthy weight  Body mass index (BMI) is a measurement that can be used to identify possible weight problems. It estimates body fat based on height and weight. Your health care provider can help determine your BMI and help you achieve or maintain a healthy weight.  For females 44 years of age and older:   A BMI below 18.5 is considered underweight.  A BMI of 18.5 to 24.9 is normal.  A BMI of 25 to 29.9 is considered overweight.  A BMI of 30 and above is considered  obese.  Watch levels of cholesterol and blood lipids  You should start having your blood tested for lipids and cholesterol at 23 years of age, then have this test every 5 years.  You may need to have your cholesterol levels checked more often if:  Your lipid or cholesterol levels are high.  You are older than 23 years of age.  You are at high risk for heart disease.  CANCER SCREENING   Lung Cancer  Lung cancer screening is recommended for adults 76-63 years old who are at high risk for lung cancer because of a history of smoking.  A yearly low-dose CT scan of the lungs is recommended for people who:  Currently smoke.  Have quit within the past 15 years.  Have at least a 30-pack-year history of smoking. A pack year is smoking an average of one pack of cigarettes a day for 1 year.  Yearly screening should continue until it has been 15 years since you quit.  Yearly screening should stop if you develop a health problem that would prevent you from having lung cancer treatment.  Breast Cancer  Practice breast self-awareness. This means understanding how your breasts normally appear and feel.  It also means doing regular breast self-exams. Let your health care provider know about any changes, no matter how small.  If you are in your 20s or 30s, you should have a clinical breast exam (CBE) by a health  care provider every 1-3 years as part of a regular health exam.  If you are 34 or older, have a CBE every year. Also consider having a breast X-ray (mammogram) every year.  If you have a family history of breast cancer, talk to your health care provider about genetic screening.  If you are at high risk for breast cancer, talk to your health care provider about having an MRI and a mammogram every year.  Breast cancer gene (BRCA) assessment is recommended for women who have family members with BRCA-related cancers. BRCA-related cancers  include:  Breast.  Ovarian.  Tubal.  Peritoneal cancers.  Results of the assessment will determine the need for genetic counseling and BRCA1 and BRCA2 testing. Cervical Cancer Your health care provider may recommend that you be screened regularly for cancer of the pelvic organs (ovaries, uterus, and vagina). This screening involves a pelvic examination, including checking for microscopic changes to the surface of your cervix (Pap test). You may be encouraged to have this screening done every 3 years, beginning at age 21.  For women ages 89-65, health care providers may recommend pelvic exams and Pap testing every 3 years, or they may recommend the Pap and pelvic exam, combined with testing for human papilloma virus (HPV), every 5 years. Some types of HPV increase your risk of cervical cancer. Testing for HPV may also be done on women of any age with unclear Pap test results.  Other health care providers may not recommend any screening for nonpregnant women who are considered low risk for pelvic cancer and who do not have symptoms. Ask your health care provider if a screening pelvic exam is right for you.  If you have had past treatment for cervical cancer or a condition that could lead to cancer, you need Pap tests and screening for cancer for at least 20 years after your treatment. If Pap tests have been discontinued, your risk factors (such as having a new sexual partner) need to be reassessed to determine if screening should resume. Some women have medical problems that increase the chance of getting cervical cancer. In these cases, your health care provider may recommend more frequent screening and Pap tests. Colorectal Cancer  This type of cancer can be detected and often prevented.  Routine colorectal cancer screening usually begins at 23 years of age and continues through 23 years of age.  Your health care provider may recommend screening at an earlier age if you have risk factors for  colon cancer.  Your health care provider may also recommend using home test kits to check for hidden blood in the stool.  A small camera at the end of a tube can be used to examine your colon directly (sigmoidoscopy or colonoscopy). This is done to check for the earliest forms of colorectal cancer.  Routine screening usually begins at age 36.  Direct examination of the colon should be repeated every 5-10 years through 23 years of age. However, you may need to be screened more often if early forms of precancerous polyps or small growths are found. Skin Cancer  Check your skin from head to toe regularly.  Tell your health care provider about any new moles or changes in moles, especially if there is a change in a mole's shape or color.  Also tell your health care provider if you have a mole that is larger than the size of a pencil eraser.  Always use sunscreen. Apply sunscreen liberally and repeatedly throughout the day.  Protect  yourself by wearing long sleeves, pants, a wide-brimmed hat, and sunglasses whenever you are outside. HEART DISEASE, DIABETES, AND HIGH BLOOD PRESSURE   High blood pressure causes heart disease and increases the risk of stroke. High blood pressure is more likely to develop in:  People who have blood pressure in the high end of the normal range (130-139/85-89 mm Hg).  People who are overweight or obese.  People who are African American.  If you are 54-65 years of age, have your blood pressure checked every 3-5 years. If you are 81 years of age or older, have your blood pressure checked every year. You should have your blood pressure measured twice--once when you are at a hospital or clinic, and once when you are not at a hospital or clinic. Record the average of the two measurements. To check your blood pressure when you are not at a hospital or clinic, you can use:  An automated blood pressure machine at a pharmacy.  A home blood pressure monitor.  If you  are between 81 years and 18 years old, ask your health care provider if you should take aspirin to prevent strokes.  Have regular diabetes screenings. This involves taking a blood sample to check your fasting blood sugar level.  If you are at a normal weight and have a low risk for diabetes, have this test once every three years after 23 years of age.  If you are overweight and have a high risk for diabetes, consider being tested at a younger age or more often. PREVENTING INFECTION  Hepatitis B  If you have a higher risk for hepatitis B, you should be screened for this virus. You are considered at high risk for hepatitis B if:  You were born in a country where hepatitis B is common. Ask your health care provider which countries are considered high risk.  Your parents were born in a high-risk country, and you have not been immunized against hepatitis B (hepatitis B vaccine).  You have HIV or AIDS.  You use needles to inject street drugs.  You live with someone who has hepatitis B.  You have had sex with someone who has hepatitis B.  You get hemodialysis treatment.  You take certain medicines for conditions, including cancer, organ transplantation, and autoimmune conditions. Hepatitis C  Blood testing is recommended for:  Everyone born from [birth year] through 1963.  Anyone with known risk factors for hepatitis C. Sexually transmitted infections (STIs)  You should be screened for sexually transmitted infections (STIs) including gonorrhea and chlamydia if:  You are sexually active and are younger than 23 years of age.  You are older than 23 years of age and your health care provider tells you that you are at risk for this type of infection.  Your sexual activity has changed since you were last screened and you are at an increased risk for chlamydia or gonorrhea. Ask your health care provider if you are at risk.  If you do not have HIV, but are at risk, it may be recommended that you  take a prescription medicine daily to prevent HIV infection. This is called pre-exposure prophylaxis (PrEP). You are considered at risk if:  You are sexually active and do not regularly use condoms or know the HIV status of your partner(s).  You take drugs by injection.  You are sexually active with a partner who has HIV. Talk with your health care provider about whether you are at high risk of being infected with  HIV. If you choose to begin PrEP, you should first be tested for HIV. You should then be tested every 3 months for as long as you are taking PrEP.  PREGNANCY   If you are premenopausal and you may become pregnant, ask your health care provider about preconception counseling.  If you may become pregnant, take 400 to 800 micrograms (mcg) of folic acid every day.  If you want to prevent pregnancy, talk to your health care provider about birth control (contraception). OSTEOPOROSIS AND MENOPAUSE   Osteoporosis is a disease in which the bones lose minerals and strength with aging. This can result in serious bone fractures. Your risk for osteoporosis can be identified using a bone density scan.  If you are 53 years of age or older, or if you are at risk for osteoporosis and fractures, ask your health care provider if you should be screened.  Ask your health care provider whether you should take a calcium or vitamin D supplement to lower your risk for osteoporosis.  Menopause may have certain physical symptoms and risks.  Hormone replacement therapy may reduce some of these symptoms and risks. Talk to your health care provider about whether hormone replacement therapy is right for you.  HOME CARE INSTRUCTIONS   Schedule regular health, dental, and eye exams.  Stay current with your immunizations.   Do not use any tobacco products including cigarettes, chewing tobacco, or electronic cigarettes.  If you are pregnant, do not drink alcohol.  If you are breastfeeding, limit how  much and how often you drink alcohol.  Limit alcohol intake to no more than 1 drink per day for nonpregnant women. One drink equals 12 ounces of beer, 5 ounces of wine, or 1 ounces of hard liquor.  Do not use street drugs.  Do not share needles.  Ask your health care provider for help if you need support or information about quitting drugs.  Tell your health care provider if you often feel depressed.  Tell your health care provider if you have ever been abused or do not feel safe at home.   This information is not intended to replace advice given to you by your health care provider. Make sure you discuss any questions you have with your health care provider.   Document Released: 09/25/2010 Document Revised: 04/02/2014 Document Reviewed: 02/11/2013 Elsevier Interactive Patient Education [employer].

## 2015-11-03 ENCOUNTER — Encounter: Payer: Self-pay | Admitting: Internal Medicine

## 2015-11-03 LAB — CYTOLOGY - PAP

## 2015-11-07 ENCOUNTER — Other Ambulatory Visit: Payer: Self-pay | Admitting: Family Medicine

## 2015-11-07 DIAGNOSIS — N879 Dysplasia of cervix uteri, unspecified: Secondary | ICD-10-CM

## 2015-11-07 DIAGNOSIS — R87619 Unspecified abnormal cytological findings in specimens from cervix uteri: Secondary | ICD-10-CM

## 2015-11-07 NOTE — Progress Notes (Signed)
Referral to gynecology

## 2016-03-27 ENCOUNTER — Telehealth: Payer: Self-pay | Admitting: Internal Medicine

## 2016-03-27 DIAGNOSIS — Z3041 Encounter for surveillance of contraceptive pills: Secondary | ICD-10-CM

## 2016-03-27 DIAGNOSIS — Z0000 Encounter for general adult medical examination without abnormal findings: Secondary | ICD-10-CM

## 2016-03-27 DIAGNOSIS — J45990 Exercise induced bronchospasm: Secondary | ICD-10-CM

## 2016-03-27 DIAGNOSIS — D1803 Hemangioma of intra-abdominal structures: Secondary | ICD-10-CM

## 2016-03-27 DIAGNOSIS — Z113 Encounter for screening for infections with a predominantly sexual mode of transmission: Secondary | ICD-10-CM

## 2016-03-27 MED ORDER — ALBUTEROL SULFATE HFA 108 (90 BASE) MCG/ACT IN AERS: 2.0000 | INHALATION_SPRAY | RESPIRATORY_TRACT | 2 refills | Status: AC | PRN

## 2016-03-27 NOTE — Telephone Encounter (Signed)
Sent to the pharmacy by e-scribe. 

## 2016-03-27 NOTE — Telephone Encounter (Signed)
Ok to refill x 3 

## 2016-03-27 NOTE — Telephone Encounter (Signed)
Pt needs new rx albuterol inhaler [facility] [address] [place] [phone]

## 2016-04-08 DIAGNOSIS — M79672 Pain in left foot: Secondary | ICD-10-CM | POA: Diagnosis not present

## 2016-04-08 DIAGNOSIS — M79671 Pain in right foot: Secondary | ICD-10-CM | POA: Diagnosis not present

## 2016-04-17 DIAGNOSIS — M67472 Ganglion, left ankle and foot: Secondary | ICD-10-CM | POA: Diagnosis not present

## 2016-04-17 DIAGNOSIS — M84375A Stress fracture, left foot, initial encounter for fracture: Secondary | ICD-10-CM | POA: Diagnosis not present

## 2016-04-18 ENCOUNTER — Ambulatory Visit (INDEPENDENT_AMBULATORY_CARE_PROVIDER_SITE_OTHER): Payer: 59 | Admitting: Family Medicine

## 2016-04-18 DIAGNOSIS — R05 Cough: Secondary | ICD-10-CM | POA: Diagnosis not present

## 2016-04-18 DIAGNOSIS — R059 Cough, unspecified: Secondary | ICD-10-CM

## 2016-04-18 DIAGNOSIS — Z23 Encounter for immunization: Secondary | ICD-10-CM

## 2016-04-18 MED ORDER — HYDROCODONE-HOMATROPINE 5-1.5 MG/5ML PO SYRP: 5.0000 mL | ORAL_SOLUTION | Freq: Four times a day (QID) | ORAL | 0 refills | Status: AC | PRN

## 2016-04-18 MED ORDER — BENZONATATE 200 MG PO CAPS: 200.0000 mg | ORAL_CAPSULE | Freq: Three times a day (TID) | ORAL | 0 refills | Status: DC | PRN

## 2016-04-18 NOTE — Patient Instructions (Signed)

## 2016-04-18 NOTE — Progress Notes (Signed)
Subjective:     Patient ID: Kathleen Hodges, female   DOB: 09/15/1990, 26 y.o.   MRN: [id number]  HPI Patient seen with cough for past 2-3 weeks. She's had some nasal congestion as well. She had some symptoms a few weeks ago and seemed to be improving slightly until a few days ago. She's not any fever. No chills. 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 No history of asthma. May have some mild exercise-induced asthma and has had albuterol inhaler in the past but is not aware of any recent wheezing. She does have some nasal congestion and postnasal drip symptoms.  Past Medical History:  Diagnosis Date  . Abdominal pain, recurrent 06/20/2011  . Fracture of metatarsal bone of right foot    Third 2016 basketball  . OM (otitis media)    hx  . SHIN SPLINTS 04/21/2009   Qualifier: Diagnosis of  By: Regis Bill MD, Standley Brooking    Past Surgical History:  Procedure Laterality Date  . ANTERIOR CRUCIATE LIGAMENT REPAIR Left    murphy wainer 2015  . TYMPANOSTOMY     age 67    reports that she is a non-smoker but has been exposed to tobacco smoke. She has never used smokeless tobacco. She reports that she does not drink alcohol or use drugs. family history includes Asthma in her brother and mother; Hyperlipidemia in her father; Obesity in her sister. Allergies  Allergen Reactions  . Penicillins      Review of Systems  Constitutional: Negative for chills and fever.  HENT: Positive for congestion.   Respiratory: Positive for cough.        Objective:   Physical Exam  Constitutional: She appears well-developed and well-nourished.  HENT:  Right Ear: External ear normal.  Left Ear: External ear normal.  Mouth/Throat: Oropharynx is clear and moist.  Neck: Neck supple.  Cardiovascular: Normal rate and regular rhythm.   Pulmonary/Chest: Effort normal and breath sounds normal. No respiratory distress. She has no wheezes. She has no rales.  Lymphadenopathy:    She has no cervical adenopathy.        Assessment:     Cough. Suspect acute viral bronchitis. No wheezing noted at this time    Plan:     -Tessalon Perles 200 mg every 8 hours as needed for cough -If not relieved with the above, Hycodan cough syrup 1 teaspoon daily at bedtime for severe cough Follow-up promptly for fever or worsening symptoms  Eulas Post MD [facility]

## 2016-04-18 NOTE — Progress Notes (Signed)
Pre visit review using our clinic review tool, if applicable. No additional management support is needed unless otherwise documented below in the visit note. 

## 2016-09-29 ENCOUNTER — Other Ambulatory Visit: Payer: Self-pay | Admitting: Internal Medicine

## 2016-10-26 NOTE — Progress Notes (Signed)
Chief Complaint  Patient presents with  . Annual Exam    HPI: Patient  Kathleen Hodges  26 y.o. comes in today for [name] visit  Masters  Graduating .   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  Feels well  rechetn intermittent ri nose bleed off and on since bad cold in spring  Dose rub nose a bit  Only lasts a minutes or less    To see neruo  Eye doc ? Abnormal optic nerve on r  Hx of ha not problematic  Off ocps  Prn inhaler with colds otherwise no asthma flare.    Health Maintenance  Topic Date Due  . INFLUENZA VACCINE  10/24/2016  . TETANUS/TDAP  10/27/2024  . HIV Screening  Completed   Health Maintenance Review LIFESTYLE:  Exercise:  Feel like   2 x per week .  Tobacco/ETS:  None  Alcohol:   Once a week  Sugar beverages:daily juice   1 cup.  Sleep:  Average 6- 7   Drug use: no   HH of hh of 2   No pets athome  Work:school 3 per days   On line.   .    December finish  Thinking of law school ELON Periods  4-5 days  Off ocps .    No risk of pregnancy .   No.  Inhaler as needed.  m1 partner for 2 years  No major injuries . Had cyst on foot.  Last pap   A year ago .   Normal   Dr Garwin Brothers.     ROS:  GEN/ HEENT: No fever, significant weight changes sweats  vision problems hearing changes, CV/ PULM; No chest pain shortness of breath cough, syncope,edema  change in exercise tolerance. GI /GU: No adominal pain, vomiting, change in bowel habits. No blood in the stool. No significant GU symptoms. SKIN/HEME: ,no acute skin rashes suspicious lesions or bleeding. No lymphadenopathy, nodules, masses.  NEURO/ PSYCH:  No neurologic signs such as weakness numbness. No depression anxiety. IMM/ Allergy: No unusual infections.  Allergy .   REST of 12 system review negative except as per HPI   Past Medical History:  Diagnosis Date  . Abdominal pain, recurrent 06/20/2011  . Fracture of metatarsal bone of right foot    Third 2016  basketball  . OM (otitis media)    hx  . SHIN SPLINTS 04/21/2009   Qualifier: Diagnosis of  By: Regis Bill MD, Standley Brooking     Past Surgical History:  Procedure Laterality Date  . ANTERIOR CRUCIATE LIGAMENT REPAIR Left    murphy wainer 2015  . TYMPANOSTOMY     age 89    Family History  Problem Relation Age of Onset  . Asthma Mother   . Hyperlipidemia Father   . Obesity Sister   . Asthma Brother     Social History   Social History  . Marital status: Single    Spouse name: N/A  . Number of children: N/A  . Years of education: N/A   Social History Main Topics  . Smoking status: Passive Smoke Exposure - Never Smoker  . Smokeless tobacco: Never Used  . Alcohol use No  . Drug use: No  . Sexual activity: Not Asked   Other Topics Concern  . 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 albuterol (VENTOLIN HFA) 108 (90 Base) MCG/ACT inhaler Inhale 2 puffs into the lungs every 4 (four) hours as needed for wheezing or shortness of breath. 2 puffs pre exercise  1 hours 1 Inhaler 2  . selenium sulfide (SELSUN) 2.5 % shampoo APPLY TO TRUNK & LEAVE ON OVER NIGHT. WASH OFF THE NEXT DAY. USE FOR 1 WEEK, THEN PERIODICALLY 120 mL 1  . benzonatate (TESSALON) 200 MG capsule Take 1 capsule (200 mg total) by mouth 3 (three) times daily as needed. 30 capsule 0  . JUNEL FE 1.5/30 1.5-30 MG-MCG tablet TAKE 1 TABLET EVERY DAY 84 tablet 1   No facility-administered medications prior to visit.      EXAM:  BP 110/80 (BP Location: Right Arm, Patient Position: Sitting, Cuff Size: Normal)   Pulse (!) 105   Temp 98.3 F (36.8 C) (Oral)   Ht 5' 10.5" (1.791 m)   Wt 175 lb 12.8 oz (79.7 kg)    BMI 24.87 kg/m   Body mass index is 24.87 kg/m. Wt Readings from Last 3 Encounters:  10/29/16 175 lb 12.8 oz (79.7 kg)  11/02/15 177 lb 4.8 oz (80.4 kg)  10/28/14 176 lb 6.4 oz (80 kg)    Physical Exam: Vital signs reviewed THS:IMXG is a well-developed well-nourished alert cooperative    who appearsr stated age in no acute distress.  HEENT: normocephalic atraumatic , Eyes: PERRL EOM's full, conjunctiva clear, Nares: paten,t no deformity discharge or tenderness., ? Scab ant nares no blood  No obv obstuction  Ears: no deformity EAC's clear TMs with normal landmarks. Mouth: clear OP, no lesions, edema.  Moist mucous membranes. Dentition in adequate repair. NECK: supple without masses, thyromegaly or bruits. CHEST/PULM:  Clear to auscultation and percussion breath sounds equal no wheeze , rales or rhonchi. No chest wall deformities or tenderness. Breast: normal by inspection . No dimpling, discharge, masses, tenderness or discharge . CV: PMI is nondisplaced, S1 S2 no gallops, murmurs, rubs. Peripheral pulses are full without delay.No JVD .  ABDOMEN: Bowel sounds normal nontender  No guard or rebound, no hepato splenomegal no CVA tenderness.  No hernia. Extremtities:  No clubbing cyanosis or edema, no acute joint swelling or redness no focal atrophy NEURO:  Oriented x3, cranial nerves 3-12 appear to be intact, no obvious focal weakness,gait within normal limits no abnormal reflexes or asymmetrical SKIN: No acute rashes normal turgor, color, no bruising or petechiae. PSYCH: Oriented, good eye contact, no obvious depression anxiety, cognition and judgment appear normal. LN: no cervical axillary inguinal adenopathy    BP Readings from Last 3 Encounters:  10/29/16 110/80  11/02/15 122/82  10/28/14 122/70    Lab results reviewed with patient   ASSESSMENT AND PLAN:  Discussed the following assessment and plan:  Visit for preventive health examination - gyne care for   abn pap    per dr  Cherly Hensen  - Plan: CBC with Differential/Platelet, HIV antibody, GC/Chlamydia Probe Amp, RPR  H/O epistaxis - r ant seesm local  not sig to health chek cbc saline nose spray plan fu if progressive etc  - Plan: CBC with Differential/Platelet, HIV  antibody, GC/Chlamydia Probe Amp, RPR  Routine screening for STI (sexually transmitted infection) - Plan: CBC with Differential/Platelet, HIV antibody, GC/Chlamydia Probe Amp, RPR  Patient Care Team: Panosh, Standley Brooking, MD as PCP - General (Internal Medicine) Patient Instructions   Most nose bleeds are from irritation  And dryness in front of nose .  Use saline nose spray and try not to manipulate the area.  checking screening test and  Anemia check today  Expect normal .   Continue lifestyle intervention healthy eating and exercise .  Get your pap etc from Dr Garwin Brothers office    Preventive Care for [place], Female The transition to life after high school as a young adult can be a stressful time with many changes. You may start seeing a primary care physician instead of a pediatrician. This is the time when your health care becomes your responsibility. Preventive care refers to lifestyle choices and visits with your health care provider that can promote health and wellness. What does preventive care include?  A yearly physical exam. This is also called an annual wellness visit.  Dental exams once or twice a year.  Routine eye exams. Ask your health care provider how often you should have your eyes checked.  Personal lifestyle choices, including: ? Daily care of your teeth and gums. ? Regular physical activity. ? Eating a healthy diet. ? Avoiding tobacco and drug use. ? Avoiding or limiting alcohol use. ? Practicing safe sex. ? Taking vitamin and mineral supplements as recommended by your health care provider. What happens during an annual wellness visit? Preventive care starts with a yearly visit to your primary care physician. The  services and screenings done by your health care provider during your annual wellness visit will depend on your overall health, lifestyle risk factors, and family history of disease. Counseling Your health care provider may ask you questions about:  Past medical problems and your family's medical history.  Medicines or supplements you take.  Health insurance and access to health care.  Alcohol, tobacco, and drug use.  Your safety at home, work, or school.  Access to firearms.  Emotional well-being and how you cope with stress.  Relationship well-being.  Diet, exercise, and sleep habits.  Your sexual health and activity.  Your methods of birth control.  Your menstrual cycle.  Your pregnancy history.  Screening You may have the following tests or measurements:  Height, weight, and BMI.  Blood pressure.  Lipid and cholesterol levels.  Tuberculosis skin test.  Skin exam.  Vision and hearing tests.  Screening test for hepatitis.  Screening tests for sexually transmitted diseases (STDs), if you are at risk.  BRCA-related cancer screening. This may be done if you have a family history of breast, ovarian, tubal, or peritoneal cancers.  Pelvic exam and Pap test. This may be done every 3 years starting at age 56.  Vaccines Your health care provider may recommend certain vaccines, such as:  Influenza vaccine. This is recommended every year.  Tetanus, diphtheria, and acellular pertussis (Tdap, Td) vaccine. You may need a Td booster every 10 years.  Varicella vaccine. You may need this if you have not been vaccinated.  HPV vaccine. If you are 78 or younger, you may need three doses over 6 months.  Measles, mumps, and rubella (MMR) vaccine. You may need at least one dose of MMR. You may also need a second dose.  Pneumococcal 13-valent conjugate (PCV13) vaccine. You may need this if you have certain conditions and  were not previously vaccinated.  Pneumococcal  polysaccharide (PPSV23) vaccine. You may need one or two doses if you smoke cigarettes or if you have certain conditions.  Meningococcal vaccine. One dose is recommended if you are age 17-21 years and a first-year college student living in a residence hall, or if you have one of several medical conditions. You may also need additional booster doses.  Hepatitis A vaccine. You may need this if you have certain conditions or if you travel or work in places where you may be exposed to hepatitis A.  Hepatitis B vaccine. You may need this if you have certain conditions or if you travel or work in places where you may be exposed to hepatitis B.  Haemophilus influenzae type b (Hib) vaccine. You may need this if you have certain risk factors.  Talk to your health care provider about which screenings and vaccines you need and how often you need them. What steps can I take to develop healthy behaviors?  Have regular preventive health care visits with your primary care physician and dentist.  Eat a healthy diet.  Drink enough fluid to keep your urine clear or pale yellow.  Stay active. Exercise at least 30 minutes 5 or more days of the week.  Use alcohol responsibly.  Maintain a healthy weight.  Do not use any products that contain nicotine, such as cigarettes, chewing tobacco, and e-cigarettes. If you need help quitting, ask your health care provider.  Do not use drugs.  Practice safe sex.  Use birth control (contraception) to prevent unwanted pregnancy. If you plan to become pregnant, see your health care provider for a pre-conception visit.  Find healthy ways to manage stress. How can I protect myself from injury? Injuries from violence or accidents are the leading cause of death among young adults and can often be prevented. Take these steps to help protect yourself:  Always wear your seat belt while driving or riding in a vehicle.  Do not drive if you have been drinking alcohol. Do  not ride with someone who has been drinking.  Do not drive when you are tired or distracted. Do not text while driving.  Wear a helmet and other protective equipment during sports activities.  If you have firearms in your house, make sure you follow all gun safety procedures.  Seek help if you have been bullied, physically abused, or sexually abused.  Use the Internet responsibly to avoid dangers such as online bullying and online sexual predators.  What can I do to cope with stress? Young adults may face many new challenges that can be stressful, such as finding a job, going to college, moving away from home, managing money, being in a relationship, getting married, and having children. To manage stress:  Avoid known stressful situations when you can.  Exercise regularly.  Find a stress-reducing activity that works best for you. Examples include meditation, yoga, listening to music, or reading.  Spend time in nature.  Keep a journal to write about your stress and how you respond.  Talk to your health care provider about stress. He or she may suggest counseling.  Spend time with supportive friends or family.  Do not cope with stress by: ? Drinking alcohol or using drugs. ? Smoking cigarettes. ? Eating.  Where can I get more information? Learn more about preventive care and healthy habits from:  [place] and Gynecologists: KaraokeExchange.nl  U.S. Probation officer Task Force: StageSync.si  National Adolescent and Young Adult  White: StrategicRoad.nl  American Academy of Pediatrics Bright Futures: https://brightfutures.MemberVerification.co.za  Society for Adolescent Health and Medicine: MoralBlog.co.za.aspx  PodExchange.nl:  ToyLending.fr  This information is not intended to replace advice given to you by your health care provider. Make sure you discuss any questions you have with your health care provider. Document Released: 07/28/2015 Document Revised: 08/18/2015 Document Reviewed: 07/28/2015 Elsevier Interactive Patient Education  2017 [place]. Panosh M.D.

## 2016-10-29 ENCOUNTER — Ambulatory Visit (INDEPENDENT_AMBULATORY_CARE_PROVIDER_SITE_OTHER): Payer: 59 | Admitting: Internal Medicine

## 2016-10-29 ENCOUNTER — Encounter: Payer: Self-pay | Admitting: Neurology

## 2016-10-29 ENCOUNTER — Encounter: Payer: Self-pay | Admitting: Internal Medicine

## 2016-10-29 ENCOUNTER — Ambulatory Visit (INDEPENDENT_AMBULATORY_CARE_PROVIDER_SITE_OTHER): Payer: 59 | Admitting: Neurology

## 2016-10-29 VITALS — BP 110/80 | HR 105 | Temp 98.3°F | Ht 70.5 in | Wt 175.8 lb

## 2016-10-29 VITALS — BP 128/79 | HR 123 | Ht 70.0 in | Wt 176.2 lb

## 2016-10-29 DIAGNOSIS — L700 Acne vulgaris: Secondary | ICD-10-CM | POA: Diagnosis not present

## 2016-10-29 DIAGNOSIS — Z87898 Personal history of other specified conditions: Secondary | ICD-10-CM | POA: Diagnosis not present

## 2016-10-29 DIAGNOSIS — H539 Unspecified visual disturbance: Secondary | ICD-10-CM | POA: Diagnosis not present

## 2016-10-29 DIAGNOSIS — Z113 Encounter for screening for infections with a predominantly sexual mode of transmission: Secondary | ICD-10-CM | POA: Diagnosis not present

## 2016-10-29 DIAGNOSIS — H4710 Unspecified papilledema: Secondary | ICD-10-CM | POA: Diagnosis not present

## 2016-10-29 DIAGNOSIS — G8929 Other chronic pain: Secondary | ICD-10-CM

## 2016-10-29 DIAGNOSIS — Z0000 Encounter for general adult medical examination without abnormal findings: Secondary | ICD-10-CM

## 2016-10-29 DIAGNOSIS — H5712 Ocular pain, left eye: Secondary | ICD-10-CM

## 2016-10-29 DIAGNOSIS — H05119 Granuloma of unspecified orbit: Secondary | ICD-10-CM

## 2016-10-29 DIAGNOSIS — R51 Headache: Secondary | ICD-10-CM

## 2016-10-29 DIAGNOSIS — R519 Headache, unspecified: Secondary | ICD-10-CM

## 2016-10-29 LAB — CBC WITH DIFFERENTIAL/PLATELET
BASOS PCT: 1 % (ref 0.0–3.0)
Basophils Absolute: 0 10*3/uL (ref 0.0–0.1)
EOS PCT: 1.6 % (ref 0.0–5.0)
Eosinophils Absolute: 0 10*3/uL (ref 0.0–0.7)
HEMATOCRIT: 42 % (ref 36.0–46.0)
Hemoglobin: 13.8 g/dL (ref 12.0–15.0)
LYMPHS ABS: 1.3 10*3/uL (ref 0.7–4.0)
LYMPHS PCT: 46.7 % — AB (ref 12.0–46.0)
MCHC: 32.8 g/dL (ref 30.0–36.0)
MCV: 91.5 fl (ref 78.0–100.0)
MONOS PCT: 8.1 % (ref 3.0–12.0)
Monocytes Absolute: 0.2 10*3/uL (ref 0.1–1.0)
NEUTROS ABS: 1.2 10*3/uL — AB (ref 1.4–7.7)
NEUTROS PCT: 42.6 % — AB (ref 43.0–77.0)
Platelets: 264 10*3/uL (ref 150.0–400.0)
RBC: 4.59 Mil/uL (ref 3.87–5.11)
RDW: 13.4 % (ref 11.5–15.5)
WBC: 2.8 10*3/uL — ABNORMAL LOW (ref 4.0–10.5)

## 2016-10-29 MED ORDER — ALPRAZOLAM 0.5 MG PO TABS: ORAL_TABLET | ORAL | 0 refills | Status: DC

## 2016-10-29 NOTE — Addendum Note (Signed)
Addended by: Monte Fantasia on: 10/29/2016 04:00 PM   Modules accepted: Orders

## 2016-10-29 NOTE — Progress Notes (Addendum)
[facility]    Provider:  Dr Jaynee Eagles Referring Provider: Robins OD Primary Care Physician:  Burnis Medin, MD  CC:  Optic nerve head edema  HPI:  Kathleen Hodges is a 26 y.o. female here as a referral for optic nerve head edema.She has been having pressure behind the left eye. Feels better when she took off her contacts. Symptoms started 2 summers ago. The pain and pressure behind the left eye worsened and she went to see opthalmology. She has a dull aching behind the left eye 2-3x a month, can last a few hours, no light or sound sensitivity, no nausea, mild. No vision changes associated. No IUD. No acutane or high doses of vitamin A. She was taking a hair and nail vitamin. Headaches not worse laying down. Headaches worse with stress. No other focal neurologic deficits, associated symptoms, inciting events or modifiable factors.  Reviewed notes, labs and imaging from outside physicians, which showed:  Reviewed ophthalmology notes: OD 20/20, OS 20/20, with refraction correction. Slit lamp test was normal, endoscopic exam showed AV crossing OS, optic nerve was temporarily indistinct inferior indistinct nasal indistinct and superior indistinct OD, OS with inferior indistinct, superior indistinct diagnosed with optic nerve head edema OD>OS.   Review of Systems: Patient complains of symptoms per HPI as well as the following symptoms: headache, eye pain. Pertinent negatives and positives per HPI. All others negative.   Social History   Social History  . Marital status: Single    Spouse name: N/A  . Number of children: 0  . Years of education: 16+   Occupational History  . Student    Social History Main Topics  . Smoking status: Passive Smoke Exposure - Never Smoker  . Smokeless tobacco: Never Used  . Alcohol use Yes     Comment: Couple of times per month  . Drug use: No  . Sexual activity: Not on file   Other Topics Concern  . Not on  file   Social History Narrative   HH of 5   Born in [place]   Father smokes not in house   Pet dog   GDS  Graduated    Basket ball and  Track      [employer] on [employer], site now to [employer]   Lives w/ roommate   Right-handed   Caffeine: 1 cup coffee every few months             Family History  Problem Relation Age of Onset  . Asthma Mother   . Uterine cancer Mother   . Hyperlipidemia Father   . Obesity Sister   . Asthma Brother   . Headache Neg Hx     Past Medical History:  Diagnosis Date  . Abdominal pain, recurrent 06/20/2011  . Fracture of metatarsal bone of right foot    Third 2016 basketball  . OM (otitis media)    hx  . SHIN SPLINTS 04/21/2009   Qualifier: Diagnosis of  By: Regis Bill MD, Standley Brooking     Past Surgical History:  Procedure Laterality Date  . ANTERIOR CRUCIATE LIGAMENT REPAIR Left    murphy wainer 2015  . TYMPANOSTOMY     age 28    Current Outpatient Prescriptions  Medication Sig Dispense Refill  . albuterol (VENTOLIN HFA) 108 (90 Base) MCG/ACT inhaler Inhale 2 puffs into the lungs every 4 (four) hours as needed for wheezing or  shortness of breath. 2 puffs pre exercise  1 hours 1 Inhaler 2  . selenium sulfide (SELSUN) 2.5 % shampoo APPLY TO TRUNK & LEAVE ON OVER NIGHT. [place] OFF THE NEXT DAY. USE FOR 1 WEEK, THEN PERIODICALLY 120 mL 1   No current facility-administered medications for this visit.     Allergies as of 10/29/2016 - Review Complete 10/29/2016  Allergen Reaction Noted  . Penicillins      Vitals: BP 128/79   Pulse (!) 123   Ht 5\' 10"  (1.778 m)   Wt 176 lb 3.2 oz (79.9 kg)   BMI 25.28 kg/m  Last Weight:  Wt Readings from Last 1 Encounters:  10/29/16 176 lb 3.2 oz (79.9 kg)   Last Height:   Ht Readings from Last 1 Encounters:  10/29/16 5\' 10"  (1.778 m)     Physical exam: Exam: Gen: NAD, conversant, well nourised, well groomed                     CV: RRR,  no MRG. No Carotid Bruits. No peripheral edema, warm, nontender Eyes: Conjunctivae clear without exudates or hemorrhage  Neuro: Detailed Neurologic Exam  Speech:    Speech is normal; fluent and spontaneous with normal comprehension.  Cognition:    The patient is oriented to person, place, and time;     recent and remote memory intact;     language fluent;     normal attention, concentration,     fund of knowledge Cranial Nerves:    The pupils are equal, round, and reactive to light. Bilateral optic nerve head edema. Visual fields are full to finger confrontation. Extraocular movements are intact. Trigeminal sensation is intact and the muscles of mastication are normal. The face is symmetric. The palate elevates in the midline. Hearing intact. Voice is normal. Shoulder shrug is normal. The tongue has normal motion without fasciculations.   Coordination:    Normal finger to nose and heel to shin. Normal rapid alternating movements.   Gait:    Heel-toe and tandem gait are normal.   Motor Observation:    No asymmetry, no atrophy, and no involuntary movements noted. Tone:    Normal muscle tone.    Posture:    Posture is normal. normal erect    Strength:    Strength is V/V in the upper and lower limbs.      Sensation: decreased in a median distribution left hand     Reflex Exam:  DTR's:    Deep tendon reflexes in the upper and lower extremities are normal bilaterally.   Toes:    The toes are downgoing bilaterally.   Clonus:    Clonus is absent.     Assessment/Plan:  This is a 26 year old patient with 2 years of eye pain and headaches with worsening papilledema confirmed by ophthalmologist in comparing with previous funduscopic pictures. We need an MRI of the brain with and without contrast to evaluate for any space-occupying lesions, intracranial hypertension or other etiologies. We need an MRI of the orbits to evaluate for orbital pseudotumor considering pain on eye  movement. He needed MRV in this young woman with focal deficits to evaluate for cerebral venous thrombosis. Had a long discussion with patient about the etiologies the most common cause is idiopathic intracranial hypertension in this age range but have to rule out other disorders.  MRI brain, orbits, MRV (as above)  ANA, NMO, Sed, B12 and folate, anca, ace, cryoglobulin  Pending results will consider lumbar puncture  with opening pressure and CSF studies  Orders Placed This Encounter  Procedures  . MR BRAIN W WO CONTRAST  . MR MRV HEAD WO CM  . MR ORBITS W WO CONTRAST  . Comprehensive metabolic panel  . CBC  . ANA w/Reflex  . Neuromyelitis optica autoab, IgG  . Sedimentation rate  . B12 and Folate Panel  . Methylmalonic acid, serum  . Pan-ANCA  . Angiotensin converting enzyme  . Cryoglobulin  . Vitamin A    CC: [facility] Dr. Clydene Laming and dr Regis Bill  Sarina Ill, MD  [facility] [address] [place], [place] [address]  Phone [phone] Fax [phone]

## 2016-10-29 NOTE — Patient Instructions (Addendum)
Most nose bleeds are from irritation  And dryness in front of nose .  Use saline nose spray and try not to manipulate the area.  checking screening test and  Anemia check today  Expect normal .   Continue lifestyle intervention healthy eating and exercise .  Get your pap etc from Dr Garwin Brothers office    Preventive Care for Kathleen Hodges, Female The transition to life after high school as a young adult can be a stressful time with many changes. You may start seeing a primary care physician instead of a pediatrician. This is the time when your health care becomes your responsibility. Preventive care refers to lifestyle choices and visits with your health care provider that can promote health and wellness. What does preventive care include?  A yearly physical exam. This is also called an annual wellness visit.  Dental exams once or twice a year.  Routine eye exams. Ask your health care provider how often you should have your eyes checked.  Personal lifestyle choices, including: ? Daily care of your teeth and gums. ? Regular physical activity. ? Eating a healthy diet. ? Avoiding tobacco and drug use. ? Avoiding or limiting alcohol use. ? Practicing safe sex. ? Taking vitamin and mineral supplements as recommended by your health care provider. What happens during an annual wellness visit? Preventive care starts with a yearly visit to your primary care physician. The services and screenings done by your health care provider during your annual wellness visit will depend on your overall health, lifestyle risk factors, and family history of disease. Counseling Your health care provider may ask you questions about:  Past medical problems and your family's medical history.  Medicines or supplements you take.  Health insurance and access to health care.  Alcohol, tobacco, and drug use.  Your safety at home, work, or school.  Access to firearms.  Emotional well-being and how you cope  with stress.  Relationship well-being.  Diet, exercise, and sleep habits.  Your sexual health and activity.  Your methods of birth control.  Your menstrual cycle.  Your pregnancy history.  Screening You may have the following tests or measurements:  Height, weight, and BMI.  Blood pressure.  Lipid and cholesterol levels.  Tuberculosis skin test.  Skin exam.  Vision and hearing tests.  Screening test for hepatitis.  Screening tests for sexually transmitted diseases (STDs), if you are at risk.  BRCA-related cancer screening. This may be done if you have a family history of breast, ovarian, tubal, or peritoneal cancers.  Pelvic exam and Pap test. This may be done every 3 years starting at age 86.  Vaccines Your health care provider may recommend certain vaccines, such as:  Influenza vaccine. This is recommended every year.  Tetanus, diphtheria, and acellular pertussis (Tdap, Td) vaccine. You may need a Td booster every 10 years.  Varicella vaccine. You may need this if you have not been vaccinated.  HPV vaccine. If you are 77 or younger, you may need three doses over 6 months.  Measles, mumps, and rubella (MMR) vaccine. You may need at least one dose of MMR. You may also need a second dose.  Pneumococcal 13-valent conjugate (PCV13) vaccine. You may need this if you have certain conditions and were not previously vaccinated.  Pneumococcal polysaccharide (PPSV23) vaccine. You may need one or two doses if you smoke cigarettes or if you have certain conditions.  Meningococcal vaccine. One dose is recommended if you are age 45-21 years and a [employer]vertising account [employer]  college student living in a residence hall, or if you have one of several medical conditions. You may also need additional booster doses.  Hepatitis A vaccine. You may need this if you have certain conditions or if you travel or work in places where you may be exposed to hepatitis A.  Hepatitis B vaccine. You may need  this if you have certain conditions or if you travel or work in places where you may be exposed to hepatitis B.  Haemophilus influenzae type b (Hib) vaccine. You may need this if you have certain risk factors.  Talk to your health care provider about which screenings and vaccines you need and how often you need them. What steps can I take to develop healthy behaviors?  Have regular preventive health care visits with your primary care physician and dentist.  Eat a healthy diet.  Drink enough fluid to keep your urine clear or pale yellow.  Stay active. Exercise at least 30 minutes 5 or more days of the week.  Use alcohol responsibly.  Maintain a healthy weight.  Do not use any products that contain nicotine, such as cigarettes, chewing tobacco, and e-cigarettes. If you need help quitting, ask your health care provider.  Do not use drugs.  Practice safe sex.  Use birth control (contraception) to prevent unwanted pregnancy. If you plan to become pregnant, see your health care provider for a pre-conception visit.  Find healthy ways to manage stress. How can I protect myself from injury? Injuries from violence or accidents are the leading cause of death among young adults and can often be prevented. Take these steps to help protect yourself:  Always wear your seat belt while driving or riding in a vehicle.  Do not drive if you have been drinking alcohol. Do not ride with someone who has been drinking.  Do not drive when you are tired or distracted. Do not text while driving.  Wear a helmet and other protective equipment during sports activities.  If you have firearms in your house, make sure you follow all gun safety procedures.  Seek help if you have been bullied, physically abused, or sexually abused.  Use the Internet responsibly to avoid dangers such as online bullying and online sexual predators.  What can I do to cope with stress? Young adults may face many new  challenges that can be stressful, such as finding a job, going to college, moving away from home, managing money, being in a relationship, getting married, and having children. To manage stress:  Avoid known stressful situations when you can.  Exercise regularly.  Find a stress-reducing activity that works best for you. Examples include meditation, yoga, listening to music, or reading.  Spend time in nature.  Keep a journal to write about your stress and how you respond.  Talk to your health care provider about stress. He or she may suggest counseling.  Spend time with supportive friends or family.  Do not cope with stress by: ? Drinking alcohol or using drugs. ? Smoking cigarettes. ? Eating.  Where can I get more information? Learn more about preventive care and healthy habits from:  [place] and Gynecologists: KaraokeExchange.nl  U.S. Probation officer Task Force: StageSync.si  National Adolescent and [place]: StrategicRoad.nl  American Academy of Pediatrics Bright Futures: https://brightfutures.MemberVerification.co.za  Society for Adolescent Health and Medicine: MoralBlog.co.za.aspx  PodExchange.nl: ToyLending.fr  This information is not intended to replace advice given to you by your health care provider. Make sure  you discuss any questions you have with your health care provider. Document Released: 07/28/2015 Document Revised: 08/18/2015 Document Reviewed: 07/28/2015 Elsevier Interactive Patient Education  2017 [employer].

## 2016-10-29 NOTE — Patient Instructions (Signed)
Remember to drink plenty of fluid, eat healthy meals and do not skip any meals. Try to eat protein with a every meal and eat a healthy snack such as fruit or nuts in between meals. Try to keep a regular sleep-wake schedule and try to exercise daily, particularly in the form of walking, 20-30 minutes a day, if you can.   As far as diagnostic testing: Labs, imaging of the brain  I would like to see you back in 4 weeks, sooner if we need to. Please call us with any interim questions, concerns, problems, updates or refill requests. .   Our phone number is [phone]. We also have an after hours call service for urgent matters and there is a physician on-call for urgent questions. For any emergencies you know to call 911 or go to the nearest emergency room   Idiopathic Intracranial Hypertension Idiopathic intracranial hypertension (IIH) is a condition that increases pressure around the brain. The fluid that surrounds the brain and spinal cord (cerebrospinal fluid, CSF) increases and causes the pressure. Idiopathic means that the cause of this condition is not known. IIH affects the brain and spinal cord (is a neurological disorder). If this condition is not treated, it can cause vision loss or blindness. What increases the risk? You are more likely to develop this condition if:  You are severely overweight (obese).  You are a woman who has not gone through menopause.  You take certain medicines, such as birth control or steroids.  What are the signs or symptoms? Symptoms of IIH include:  Headaches. This is the most common symptom.  Pain in the shoulders or neck.  Nausea and vomiting.  A "rushing water" or pulsing sound within the ears (pulsatile tinnitus).  Double vision.  Blurred vision.  Brief episodes of complete vision loss.  How is this diagnosed? This condition may be diagnosed based on:  Your symptoms.  Your medical history.  CT scan of the brain.  MRI of the  brain.  Magnetic resonance venogram (MRV) to check veins in the brain.  Diagnostic lumbar puncture. This is a procedure to remove and examine a sample of cerebrospinal fluid. This procedure can determine whether too much fluid may be causing IIH.  A thorough eye exam to check for swelling or nerve damage in the eyes.  How is this treated? Treatment for this condition depends on your symptoms. The goal of treatment is to decrease the pressure around your brain. Common treatments include:  Medicines to decrease the production of spinal fluid and lower the pressure within your skull.  Medicines to prevent or treat headaches.  Surgery to place drains (shunts) in your brain to remove excess fluid.  Lumbar puncture to remove excess cerebrospinal fluid.  Follow these instructions at home:  If you are overweight or obese, work with your health care provider to lose weight.  Take over-the-counter and prescription medicines only as told by your health care provider.  Do not drive or use heavy machinery while taking medicines that can make you sleepy.  Keep all follow-up visits as told by your health care provider. This is important. Contact a health care provider if:  You have changes in your vision, such as: ? Double vision. ? Not being able to see colors (color vision). Get help right away if:  You have any of the following symptoms and they get worse or do not get better. ? Headaches. ? Nausea. ? Vomiting. ? Vision changes or difficulty seeing. Summary  Idiopathic  intracranial hypertension (IIH) is a condition that increases pressure around the brain. The cause is not known (is idiopathic).  The most common symptom of IIH is headaches.  Treatment may include medicines or surgery to relieve the pressure on your brain. This information is not intended to replace advice given to you by your health care provider. Make sure you discuss any questions you have with your health care  provider. Document Released: 05/21/2001 Document Revised: 02/01/2016 Document Reviewed: 02/01/2016 Elsevier Interactive Patient Education  2017 [employer].

## 2016-10-30 LAB — GC/CHLAMYDIA PROBE AMP
CT PROBE, AMP APTIMA: DETECTED — AB
GC PROBE AMP APTIMA: NOT DETECTED

## 2016-10-30 LAB — RPR

## 2016-10-30 LAB — HIV ANTIBODY (ROUTINE TESTING W REFLEX): HIV 1&2 Ab, 4th Generation: NONREACTIVE

## 2016-10-31 ENCOUNTER — Other Ambulatory Visit: Payer: Self-pay | Admitting: Emergency Medicine

## 2016-10-31 ENCOUNTER — Telehealth: Payer: Self-pay | Admitting: Neurology

## 2016-10-31 MED ORDER — AZITHROMYCIN 500 MG PO TABS: ORAL_TABLET | ORAL | 0 refills | Status: DC

## 2016-10-31 NOTE — Telephone Encounter (Signed)
Kathleen C. Spoke to the patient and her mom on 10/30/16. They stated that they are going to hold off right now on having the MRI and way other option's first before proceeding with the MRI.

## 2016-11-12 DIAGNOSIS — R87612 Low grade squamous intraepithelial lesion on cytologic smear of cervix (LGSIL): Secondary | ICD-10-CM | POA: Diagnosis not present

## 2016-11-12 DIAGNOSIS — Z01419 Encounter for gynecological examination (general) (routine) without abnormal findings: Secondary | ICD-10-CM | POA: Diagnosis not present

## 2016-11-12 DIAGNOSIS — Z6824 Body mass index (BMI) 24.0-24.9, adult: Secondary | ICD-10-CM | POA: Diagnosis not present

## 2016-12-04 ENCOUNTER — Ambulatory Visit: Payer: 59 | Admitting: Neurology

## 2016-12-14 ENCOUNTER — Encounter: Payer: Self-pay | Admitting: Internal Medicine

## 2017-03-13 DIAGNOSIS — Z8619 Personal history of other infectious and parasitic diseases: Secondary | ICD-10-CM | POA: Diagnosis not present

## 2017-03-13 DIAGNOSIS — Z118 Encounter for screening for other infectious and parasitic diseases: Secondary | ICD-10-CM | POA: Diagnosis not present

## 2017-06-19 DIAGNOSIS — L700 Acne vulgaris: Secondary | ICD-10-CM | POA: Diagnosis not present

## 2017-08-28 ENCOUNTER — Telehealth: Payer: Self-pay | Admitting: Neurology

## 2017-08-28 NOTE — Telephone Encounter (Signed)
Patient mom Pam called yesterday when I was out of the office about discussing how much her daughter's MRI would be. I left a voicemail for Pam to call me back.  [employer] Auth: [employer] via [facility].

## 2017-09-02 ENCOUNTER — Other Ambulatory Visit: Payer: Self-pay | Admitting: Neurology

## 2017-09-02 MED ORDER — ALPRAZOLAM 0.5 MG PO TABS: ORAL_TABLET | ORAL | 0 refills | Status: DC

## 2017-09-02 NOTE — Telephone Encounter (Signed)
Spoke with pt. She is aware that Dr. Jaynee Eagles ordered Xanax 2 tabs for her MRI. She can take 1 up to an hour prior to MRI and repeat if needed. Do not drive while on medication. Pt verbalized understanding and appreciation.

## 2017-09-02 NOTE — Telephone Encounter (Signed)
Thank you prescription sent to pharmacy

## 2017-09-02 NOTE — Telephone Encounter (Signed)
Pam called me back and she stated that she would like her daughter to go to a open MRI because of her nerves. I stated I will fax the order to [facility]. She also stated that her daughter will need something to help with her nerves.

## 2017-09-03 NOTE — Telephone Encounter (Signed)
Kathleen Hodges with [facility] called and informed me that the patient mom Kathleen Hodges wanted to cancel her daughters MRI because some one that scheduled them was rude. I left a voicemail on Kathleen Hodges's phone to give me a call back to see what happened.Marland Kitchen

## 2017-10-29 ENCOUNTER — Encounter: Payer: 59 | Admitting: Internal Medicine

## 2017-11-13 ENCOUNTER — Encounter: Payer: 59 | Admitting: Internal Medicine

## 2017-11-15 NOTE — Patient Instructions (Addendum)
ttention to lifestyle intervention healthy eating and exercise .    Preventive Care for [place], Female The transition to life after high school as a young adult can be a stressful time with many changes. You may start seeing a primary care physician instead of a pediatrician. This is the time when your health care becomes your responsibility. Preventive care refers to lifestyle choices and visits with your health care provider that can promote health and wellness. What does preventive care include?  A yearly physical exam. This is also called an annual wellness visit.  Dental exams once or twice a year.  Routine eye exams. Ask your health care provider how often you should have your eyes checked.  Personal lifestyle choices, including: ? Daily care of your teeth and gums. ? Regular physical activity. ? Eating a healthy diet. ? Avoiding tobacco and drug use. ? Avoiding or limiting alcohol use. ? Practicing safe sex. ? Taking vitamin and mineral supplements as recommended by your health care provider. What happens during an annual wellness visit? Preventive care starts with a yearly visit to your primary care physician. The services and screenings done by your health care provider during your annual wellness visit will depend on your overall health, lifestyle risk factors, and family history of disease. Counseling Your health care provider may ask you questions about:  Past medical problems and your family's medical history.  Medicines or supplements you take.  Health insurance and access to health care.  Alcohol, tobacco, and drug use.  Your safety at home, work, or school.  Access to firearms.  Emotional well-being and how you cope with stress.  Relationship well-being.  Diet, exercise, and sleep habits.  Your sexual health and activity.  Your methods of birth control.  Your menstrual cycle.  Your pregnancy history.  Screening You may have the following  tests or measurements:  Height, weight, and BMI.  Blood pressure.  Lipid and cholesterol levels.  Tuberculosis skin test.  Skin exam.  Vision and hearing tests.  Screening test for hepatitis.  Screening tests for sexually transmitted diseases (STDs), if you are at risk.  BRCA-related cancer screening. This may be done if you have a family history of breast, ovarian, tubal, or peritoneal cancers.  Pelvic exam and Pap test. This may be done every 3 years starting at age 20.  Vaccines Your health care provider may recommend certain vaccines, such as:  Influenza vaccine. This is recommended every year.  Tetanus, diphtheria, and acellular pertussis (Tdap, Td) vaccine. You may need a Td booster every 10 years.  Varicella vaccine. You may need this if you have not been vaccinated.  HPV vaccine. If you are 48 or younger, you may need three doses over 6 months.  Measles, mumps, and rubella (MMR) vaccine. You may need at least one dose of MMR. You may also need a second dose.  Pneumococcal 13-valent conjugate (PCV13) vaccine. You may need this if you have certain conditions and were not previously vaccinated.  Pneumococcal polysaccharide (PPSV23) vaccine. You may need one or two doses if you smoke cigarettes or if you have certain conditions.  Meningococcal vaccine. One dose is recommended if you are age 74-21 years and a first-year college student living in a residence hall, or if you have one of several medical conditions. You may also need additional booster doses.  Hepatitis A vaccine. You may need this if you have certain conditions or if you travel or work in places where you may be exposed  to hepatitis A.  Hepatitis B vaccine. You may need this if you have certain conditions or if you travel or work in places where you may be exposed to hepatitis B.  Haemophilus influenzae type b (Hib) vaccine. You may need this if you have certain risk factors.  Talk to your health care  provider about which screenings and vaccines you need and how often you need them. What steps can I take to develop healthy behaviors?  Have regular preventive health care visits with your primary care physician and dentist.  Eat a healthy diet.  Drink enough fluid to keep your urine clear or pale yellow.  Stay active. Exercise at least 30 minutes 5 or more days of the week.  Use alcohol responsibly.  Maintain a healthy weight.  Do not use any products that contain nicotine, such as cigarettes, chewing tobacco, and e-cigarettes. If you need help quitting, ask your health care provider.  Do not use drugs.  Practice safe sex.  Use birth control (contraception) to prevent unwanted pregnancy. If you plan to become pregnant, see your health care provider for a pre-conception visit.  Find healthy ways to manage stress. How can I protect myself from injury? Injuries from violence or accidents are the leading cause of death among young adults and can often be prevented. Take these steps to help protect yourself:  Always wear your seat belt while driving or riding in a vehicle.  Do not drive if you have been drinking alcohol. Do not ride with someone who has been drinking.  Do not drive when you are tired or distracted. Do not text while driving.  Wear a helmet and other protective equipment during sports activities.  If you have firearms in your house, make sure you follow all gun safety procedures.  Seek help if you have been bullied, physically abused, or sexually abused.  Use the Internet responsibly to avoid dangers such as online bullying and online sexual predators.  What can I do to cope with stress? Young adults may face many new challenges that can be stressful, such as finding a job, going to college, moving away from home, managing money, being in a relationship, getting married, and having children. To manage stress:  Avoid known stressful situations when you  can.  Exercise regularly.  Find a stress-reducing activity that works best for you. Examples include meditation, yoga, listening to music, or reading.  Spend time in nature.  Keep a journal to write about your stress and how you respond.  Talk to your health care provider about stress. He or she may suggest counseling.  Spend time with supportive friends or family.  Do not cope with stress by: ? Drinking alcohol or using drugs. ? Smoking cigarettes. ? Eating.  Where can I get more information? Learn more about preventive care and healthy habits from:  [place] and Gynecologists: KaraokeExchange.nl  U.S. Probation officer Task Force: StageSync.si  National Adolescent and [place]: StrategicRoad.nl  American Academy of Pediatrics Bright Futures: https://brightfutures.MemberVerification.co.za  Society for Adolescent Health and Medicine: MoralBlog.co.za.aspx  PodExchange.nl: ToyLending.fr  This information is not intended to replace advice given to you by your health care provider. Make sure you discuss any questions you have with your health care provider. Document Released: 07/28/2015 Document Revised: 08/18/2015 Document Reviewed: 07/28/2015 Elsevier Interactive Patient Education  [employer].

## 2017-11-15 NOTE — Progress Notes (Signed)
Chief Complaint  Patient presents with  . Annual Exam    no new concerns    HPI: Patient  Kathleen Hodges  27 y.o. comes in today for Preventive Health Care visit   Eye  Check optic nerve    To be [facility]  ? Eye findings  ocass headaches .  peri menstrual No sx but  Ok to do sti check  Has gyne check yearly .  Got masters in dec 2018   Studying ofr last looking alos for jobs .   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 INFLUENZA VACCINE  10/24/2017  . TETANUS/TDAP  10/27/2024  . HIV Screening  Completed   Health Maintenance Review LIFESTYLE:  Exercise:   Walk .  Tobacco/ETS:  no Alcohol:  ocass  Sugar beverages: juice.  Sleep:  6-7  Drug use: no HH of  4 2 dogs  Work:  Geophysical data processor.  Studying last . Some increase screen time       ROS:  GEN/ HEENT: No fever, significant weight changes sweats headaches vision problems hearing changes, CV/ PULM; No chest pain shortness of breath cough, syncope,edema  change in exercise tolerance. GI /GU: No adominal pain, vomiting, change in bowel habits. No blood in the stool. No significant GU symptoms. SKIN/HEME: ,no acute skin rashes suspicious lesions or bleeding. No lymphadenopathy, nodules, masses.  NEURO/ PSYCH:  No neurologic signs such as weakness numbness. No depression anxiety. IMM/ Allergy: No unusual infections.  Allergy .   REST of 12 system review negative except as per HPI   Past Medical History:  Diagnosis Date  . Abdominal pain, recurrent 06/20/2011  . Fracture of metatarsal bone of right foot    Third 2016 basketball  . OM (otitis media)    hx  . SHIN SPLINTS 04/21/2009   Qualifier: Diagnosis of  By: Regis Bill MD, Standley Brooking     Past Surgical History:  Procedure Laterality Date  . ANTERIOR CRUCIATE LIGAMENT REPAIR Left    murphy wainer 2015  . TYMPANOSTOMY     age 25    Family History  Problem Relation Age of Onset  . Asthma Mother   . Uterine  cancer Mother   . Hyperlipidemia Father   . Obesity Sister   . Asthma Brother   . Headache Neg Hx     Social History   Socioeconomic History  . Marital status: Single    Spouse name: Not on file  . Number of children: 0  . Years of education: 16+  . Highest education level: Not on file  Occupational History  . Occupation: Ship broker  Social Needs  . Financial resource strain: Not on file  . Food insecurity:    Worry: Not on file    Inability: Not on file  . Transportation needs:    Medical: Not on file    Non-medical: Not on file  Tobacco Use  . Smoking status: Passive Smoke Exposure - Never Smoker  . Smokeless tobacco: Never Used  Substance and Sexual Activity  . Alcohol use: Yes    Comment: Couple of times per month  . Drug use: No  . Sexual activity: Not on file  Lifestyle  . Physical activity:    Days per week: Not on file    Minutes per session: Not on file  . Stress: Not on file  Relationships  . Social connections:    Talks on  phone: Not on file    Gets together: Not on file    Attends religious service: Not on file    Active member of club or organization: Not on file    Attends meetings of clubs or organizations: Not on file    Relationship status: Not on file  Other Topics Concern  . Not on file  Social History Narrative   HH of 5   Born in [place]   Father smokes not in house   Pet dog   GDS  Graduated    Basket ball and  Track      [employer], site now to [employer]   Lives w/ roommate   Right-handed   Caffeine: 1 cup coffee every few months          Outpatient Medications Prior to Visit  Medication Sig Dispense Refill  . albuterol (VENTOLIN HFA) 108 (90 Base) MCG/ACT inhaler Inhale 2 puffs into the lungs every 4 (four) hours as needed for wheezing or shortness of breath. 2 puffs pre exercise  1 hours 1 Inhaler 2  . ALPRAZolam (XANAX) 0.5 MG tablet Take 1 tablet up to  one hour prior to MRI for anxiety, may repeat x 1 if needed. 2 tablet 0  . selenium sulfide (SELSUN) 2.5 % shampoo APPLY TO TRUNK & LEAVE ON OVER NIGHT. [place] OFF THE NEXT DAY. USE FOR 1 WEEK, THEN PERIODICALLY 120 mL 1  . azithromycin (ZITHROMAX) 500 MG tablet Take 2 tablets by mouth once (Patient not taking: Reported on 11/18/2017) 4 tablet 0   No facility-administered medications prior to visit.      EXAM:  BP 110/62 (BP Location: Right Arm, Patient Position: Sitting, Cuff Size: Normal)   Pulse (!) 112   Temp 98.6 F (37 C) (Oral)   Ht 5' 9.88" (1.775 m)   Wt 177 lb 8 oz (80.5 kg)   BMI 25.55 kg/m   Body mass index is 25.55 kg/m. Wt Readings from Last 3 Encounters:  11/18/17 177 lb 8 oz (80.5 kg)  10/29/16 176 lb 3.2 oz (79.9 kg)  10/29/16 175 lb 12.8 oz (79.7 kg)    Physical Exam: Vital signs reviewed ONG:EXBM is a well-developed well-nourished alert cooperative    who appearsr stated age in no acute distress.  HEENT: normocephalic atraumatic , Eyes: PERRL EOM's full, conjunctiva clear, Nares: paten,t no deformity discharge or tenderness., Ears: no deformity EAC's clear TMs with normal landmarks. Mouth: clear OP, no lesions, edema.  Moist mucous membranes. Dentition in adequate repair. NECK: supple without masses, thyromegaly or bruits. CHEST/PULM:  Clear to auscultation and percussion breath sounds equal no wheeze , rales or rhonchi. No chest wall deformities or tenderness. Breast: normal by inspection . No dimpling, discharge, masses, tenderness or discharge . CV: PMI is nondisplaced, S1 S2 no gallops, murmurs, rubs. Peripheral pulses are full without delay.No JVD .  ABDOMEN: Bowel sounds normal nontender  No guard or rebound, no hepato splenomegal no CVA tenderness.  No hernia. Extremtities:  No clubbing cyanosis or edema, no acute joint swelling or redness no focal atrophy NEURO:  Oriented x3, cranial nerves 3-12 appear to be intact, no obvious focal weakness,gait within  normal limits no abnormal reflexes or asymmetrical SKIN: No acute rashes normal turgor, color, no bruising or petechiae. PSYCH: Oriented, good eye contact, no obvious depression anxiety, cognition and judgment appear normal. LN: no cervical axillary inguinal adenopathy    BP  Readings from Last 3 Encounters:  11/18/17 110/62  10/29/16 128/79  10/29/16 110/80    Lab plan  Disc with patient   ASSESSMENT AND PLAN:  Discussed the following assessment and plan:  Visit for preventive health examination - Plan: Basic metabolic panel, CBC with Differential/Platelet, Hepatic function panel, Lipid panel, TSH, HIV antibody, RPR, Urine cytology ancillary only  Routine screening for STI (sexually transmitted infection) - Plan: HIV antibody, RPR, Urine cytology ancillary only  Screening, lipid - Plan: Basic metabolic panel, CBC with Differential/Platelet, Hepatic function panel, Lipid panel, TSH, HIV antibody, RPR, Urine cytology ancillary only  Leukopenia, unspecified type - Plan: CBC with Differential/Platelet  Patient Care Team: Burnis Medin, MD as PCP - General (Internal Medicine) Patient Instructions  ttention to lifestyle intervention healthy eating and exercise .    Preventive Care for [place], Female The transition to life after high school as a young adult can be a stressful time with many changes. You may start seeing a primary care physician instead of a pediatrician. This is the time when your health care becomes your responsibility. Preventive care refers to lifestyle choices and visits with your health care provider that can promote health and wellness. What does preventive care include?  A yearly physical exam. This is also called an annual wellness visit.  Dental exams once or twice a year.  Routine eye exams. Ask your health care provider how often you should have your eyes checked.  Personal lifestyle choices, including: ? Daily care of your teeth and  gums. ? Regular physical activity. ? Eating a healthy diet. ? Avoiding tobacco and drug use. ? Avoiding or limiting alcohol use. ? Practicing safe sex. ? Taking vitamin and mineral supplements as recommended by your health care provider. What happens during an annual wellness visit? Preventive care starts with a yearly visit to your primary care physician. The services and screenings done by your health care provider during your annual wellness visit will depend on your overall health, lifestyle risk factors, and family history of disease. Counseling Your health care provider may ask you questions about:  Past medical problems and your family's medical history.  Medicines or supplements you take.  Health insurance and access to health care.  Alcohol, tobacco, and drug use.  Your safety at home, work, or school.  Access to firearms.  Emotional well-being and how you cope with stress.  Relationship well-being.  Diet, exercise, and sleep habits.  Your sexual health and activity.  Your methods of birth control.  Your menstrual cycle.  Your pregnancy history.  Screening You may have the following tests or measurements:  Height, weight, and BMI.  Blood pressure.  Lipid and cholesterol levels.  Tuberculosis skin test.  Skin exam.  Vision and hearing tests.  Screening test for hepatitis.  Screening tests for sexually transmitted diseases (STDs), if you are at risk.  BRCA-related cancer screening. This may be done if you have a family history of breast, ovarian, tubal, or peritoneal cancers.  Pelvic exam and Pap test. This may be done every 3 years starting at age 53.  Vaccines Your health care provider may recommend certain vaccines, such as:  Influenza vaccine. This is recommended every year.  Tetanus, diphtheria, and acellular pertussis (Tdap, Td) vaccine. You may need a Td booster every 10 years.  Varicella vaccine. You may need this if you have not been  vaccinated.  HPV vaccine. If you are 54 or younger, you may need three doses over 6 months.  Measles, mumps, and rubella (MMR) vaccine. You may need at least one dose of MMR. You may also need a second dose.  Pneumococcal 13-valent conjugate (PCV13) vaccine. You may need this if you have certain conditions and were not previously vaccinated.  Pneumococcal polysaccharide (PPSV23) vaccine. You may need one or two doses if you smoke cigarettes or if you have certain conditions.  Meningococcal vaccine. One dose is recommended if you are age 30-21 years and a first-year college student living in a residence hall, or if you have one of several medical conditions. You may also need additional booster doses.  Hepatitis A vaccine. You may need this if you have certain conditions or if you travel or work in places where you may be exposed to hepatitis A.  Hepatitis B vaccine. You may need this if you have certain conditions or if you travel or work in places where you may be exposed to hepatitis B.  Haemophilus influenzae type b (Hib) vaccine. You may need this if you have certain risk factors.  Talk to your health care provider about which screenings and vaccines you need and how often you need them. What steps can I take to develop healthy behaviors?  Have regular preventive health care visits with your primary care physician and dentist.  Eat a healthy diet.  Drink enough fluid to keep your urine clear or pale yellow.  Stay active. Exercise at least 30 minutes 5 or more days of the week.  Use alcohol responsibly.  Maintain a healthy weight.  Do not use any products that contain nicotine, such as cigarettes, chewing tobacco, and e-cigarettes. If you need help quitting, ask your health care provider.  Do not use drugs.  Practice safe sex.  Use birth control (contraception) to prevent unwanted pregnancy. If you plan to become pregnant, see your health care provider for a pre-conception  visit.  Find healthy ways to manage stress. How can I protect myself from injury? Injuries from violence or accidents are the leading cause of death among young adults and can often be prevented. Take these steps to help protect yourself:  Always wear your seat belt while driving or riding in a vehicle.  Do not drive if you have been drinking alcohol. Do not ride with someone who has been drinking.  Do not drive when you are tired or distracted. Do not text while driving.  Wear a helmet and other protective equipment during sports activities.  If you have firearms in your house, make sure you follow all gun safety procedures.  Seek help if you have been bullied, physically abused, or sexually abused.  Use the Internet responsibly to avoid dangers such as online bullying and online sexual predators.  What can I do to cope with stress? Young adults may face many new challenges that can be stressful, such as finding a job, going to college, moving away from home, managing money, being in a relationship, getting married, and having children. To manage stress:  Avoid known stressful situations when you can.  Exercise regularly.  Find a stress-reducing activity that works best for you. Examples include meditation, yoga, listening to music, or reading.  Spend time in nature.  Keep a journal to write about your stress and how you respond.  Talk to your health care provider about stress. He or she may suggest counseling.  Spend time with supportive friends or family.  Do not cope with stress by: ? Drinking alcohol or using drugs. ? Smoking cigarettes. ? Eating.  Where can I get more information? Learn more about preventive care and healthy habits from:  Baker and Gynecologists: KaraokeExchange.nl  U.S. Probation officer Task Force: StageSync.si  National Adolescent and  [place]: StrategicRoad.nl  American Academy of Pediatrics Bright Futures: https://brightfutures.MemberVerification.co.za  Society for Adolescent Health and Medicine: MoralBlog.co.za.aspx  PodExchange.nl: ToyLending.fr  This information is not intended to replace advice given to you by your health care provider. Make sure you discuss any questions you have with your health care provider. Document Released: 07/28/2015 Document Revised: 08/18/2015 Document Reviewed: 07/28/2015 Elsevier Interactive Patient Education  2018 [place]. Keirsten Matuska M.D.

## 2017-11-18 ENCOUNTER — Encounter: Payer: Self-pay | Admitting: Internal Medicine

## 2017-11-18 ENCOUNTER — Ambulatory Visit (INDEPENDENT_AMBULATORY_CARE_PROVIDER_SITE_OTHER): Payer: 59 | Admitting: Internal Medicine

## 2017-11-18 ENCOUNTER — Other Ambulatory Visit (HOSPITAL_COMMUNITY)
Admission: RE | Admit: 2017-11-18 | Discharge: 2017-11-18 | Disposition: A | Discharge status: Home / Self Care | Payer: 59 | Source: Ambulatory Visit | Attending: Internal Medicine | Admitting: Internal Medicine

## 2017-11-18 VITALS — BP 110/62 | HR 112 | Temp 98.6°F | Ht 69.88 in | Wt 177.5 lb

## 2017-11-18 DIAGNOSIS — Z0000 Encounter for general adult medical examination without abnormal findings: Secondary | ICD-10-CM | POA: Insufficient documentation

## 2017-11-18 DIAGNOSIS — D72819 Decreased white blood cell count, unspecified: Secondary | ICD-10-CM | POA: Diagnosis not present

## 2017-11-18 DIAGNOSIS — Z113 Encounter for screening for infections with a predominantly sexual mode of transmission: Secondary | ICD-10-CM | POA: Insufficient documentation

## 2017-11-18 DIAGNOSIS — Z13220 Encounter for screening for lipoid disorders: Secondary | ICD-10-CM

## 2017-11-18 LAB — HEPATIC FUNCTION PANEL
ALK PHOS: 49 U/L (ref 39–117)
ALT: 14 U/L (ref 0–35)
AST: 17 U/L (ref 0–37)
Albumin: 4.5 g/dL (ref 3.5–5.2)
BILIRUBIN DIRECT: 0.1 mg/dL (ref 0.0–0.3)
Total Bilirubin: 0.5 mg/dL (ref 0.2–1.2)
Total Protein: 8.2 g/dL (ref 6.0–8.3)

## 2017-11-18 LAB — LIPID PANEL
CHOL/HDL RATIO: 3
Cholesterol: 202 mg/dL — ABNORMAL HIGH (ref 0–200)
HDL: 65.7 mg/dL (ref >39.00)
LDL Cholesterol: 126 mg/dL — ABNORMAL HIGH (ref 0–99)
NONHDL: 135.95
Triglycerides: 52 mg/dL (ref 0.0–149.0)
VLDL: 10.4 mg/dL (ref 0.0–40.0)

## 2017-11-18 LAB — BASIC METABOLIC PANEL
BUN: 16 mg/dL (ref 6–23)
CALCIUM: 10 mg/dL (ref 8.4–10.5)
CO2: 28 mEq/L (ref 19–32)
CREATININE: 1.06 mg/dL (ref 0.40–1.20)
Chloride: 106 mEq/L (ref 96–112)
GFR: 81.33 mL/min (ref >60.00)
Glucose, Bld: 88 mg/dL (ref 70–99)
Potassium: 3.9 mEq/L (ref 3.5–5.1)
SODIUM: 140 meq/L (ref 135–145)

## 2017-11-18 LAB — CBC WITH DIFFERENTIAL/PLATELET
BASOS ABS: 0 10*3/uL (ref 0.0–0.1)
Basophils Relative: 0.6 % (ref 0.0–3.0)
EOS ABS: 0 10*3/uL (ref 0.0–0.7)
Eosinophils Relative: 0.9 % (ref 0.0–5.0)
HCT: 39.2 % (ref 36.0–46.0)
HEMOGLOBIN: 12.8 g/dL (ref 12.0–15.0)
Lymphocytes Relative: 35 % (ref 12.0–46.0)
Lymphs Abs: 1.4 10*3/uL (ref 0.7–4.0)
MCHC: 32.8 g/dL (ref 30.0–36.0)
MCV: 88.5 fl (ref 78.0–100.0)
Monocytes Absolute: 0.4 10*3/uL (ref 0.1–1.0)
Monocytes Relative: 9 % (ref 3.0–12.0)
NEUTROS ABS: 2.2 10*3/uL (ref 1.4–7.7)
Neutrophils Relative %: 54.5 % (ref 43.0–77.0)
PLATELETS: 285 10*3/uL (ref 150.0–400.0)
RBC: 4.43 Mil/uL (ref 3.87–5.11)
RDW: 13.6 % (ref 11.5–15.5)
WBC: 4.1 10*3/uL (ref 4.0–10.5)

## 2017-11-18 LAB — TSH: TSH: 2.54 u[IU]/mL (ref 0.35–4.50)

## 2017-11-19 LAB — RPR: RPR Ser Ql: REACTIVE — AB

## 2017-11-19 LAB — FLUORESCENT TREPONEMAL AB(FTA)-IGG-BLD: FLUORESCENT TREPONEMAL ABS: NONREACTIVE

## 2017-11-19 LAB — URINE CYTOLOGY ANCILLARY ONLY
Chlamydia: NEGATIVE
Neisseria Gonorrhea: NEGATIVE

## 2017-11-19 LAB — HIV ANTIBODY (ROUTINE TESTING W REFLEX): HIV 1&2 Ab, 4th Generation: NONREACTIVE

## 2017-11-19 LAB — RPR TITER

## 2017-11-29 ENCOUNTER — Other Ambulatory Visit: Payer: Self-pay | Admitting: *Deleted

## 2017-11-29 DIAGNOSIS — Z0000 Encounter for general adult medical examination without abnormal findings: Secondary | ICD-10-CM

## 2017-12-16 DIAGNOSIS — H3581 Retinal edema: Secondary | ICD-10-CM | POA: Diagnosis not present

## 2017-12-16 DIAGNOSIS — H4710 Unspecified papilledema: Secondary | ICD-10-CM | POA: Diagnosis not present

## 2017-12-27 DIAGNOSIS — H4710 Unspecified papilledema: Secondary | ICD-10-CM | POA: Diagnosis not present

## 2017-12-27 DIAGNOSIS — G9389 Other specified disorders of brain: Secondary | ICD-10-CM | POA: Diagnosis not present

## 2018-01-22 DIAGNOSIS — Z79899 Other long term (current) drug therapy: Secondary | ICD-10-CM | POA: Diagnosis not present

## 2018-02-19 DIAGNOSIS — Z6825 Body mass index (BMI) 25.0-25.9, adult: Secondary | ICD-10-CM | POA: Diagnosis not present

## 2018-02-19 DIAGNOSIS — Z01419 Encounter for gynecological examination (general) (routine) without abnormal findings: Secondary | ICD-10-CM | POA: Diagnosis not present

## 2018-02-19 DIAGNOSIS — Z118 Encounter for screening for other infectious and parasitic diseases: Secondary | ICD-10-CM | POA: Diagnosis not present

## 2018-02-19 DIAGNOSIS — Z1159 Encounter for screening for other viral diseases: Secondary | ICD-10-CM | POA: Diagnosis not present

## 2018-02-24 DIAGNOSIS — H3581 Retinal edema: Secondary | ICD-10-CM | POA: Diagnosis not present

## 2018-02-24 DIAGNOSIS — H4710 Unspecified papilledema: Secondary | ICD-10-CM | POA: Diagnosis not present

## 2018-03-04 DIAGNOSIS — Z79899 Other long term (current) drug therapy: Secondary | ICD-10-CM | POA: Diagnosis not present

## 2018-05-27 DIAGNOSIS — H3581 Retinal edema: Secondary | ICD-10-CM | POA: Diagnosis not present

## 2018-05-27 DIAGNOSIS — H47393 Other disorders of optic disc, bilateral: Secondary | ICD-10-CM | POA: Diagnosis not present

## 2018-11-17 ENCOUNTER — Encounter: Payer: Self-pay | Admitting: Internal Medicine

## 2018-12-05 NOTE — Patient Instructions (Addendum)
Glad you are doing well.  consider LARC or iud if needed in future . Work on limit sugar drinks and   Adding some exercise ( walking is ok!)    Health Maintenance, Female Adopting a healthy lifestyle and getting preventive care are important in promoting health and wellness. Ask your health care provider about:  The right schedule for you to have regular tests and exams.  Things you can do on your own to prevent diseases and keep yourself healthy. What should I know about diet, weight, and exercise? Eat a healthy diet   Eat a diet that includes plenty of vegetables, fruits, low-fat dairy products, and lean protein.  Do not eat a lot of foods that are high in solid fats, added sugars, or sodium. Maintain a healthy weight Body mass index (BMI) is used to identify weight problems. It estimates body fat based on height and weight. Your health care provider can help determine your BMI and help you achieve or maintain a healthy weight. Get regular exercise Get regular exercise. This is one of the most important things you can do for your health. Most adults should:  Exercise for at least 150 minutes each week. The exercise should increase your heart rate and make you sweat (moderate-intensity exercise).  Do strengthening exercises at least twice a week. This is in addition to the moderate-intensity exercise.  Spend less time sitting. Even light physical activity can be beneficial. Watch cholesterol and blood lipids Have your blood tested for lipids and cholesterol at 26 years of age, then have this test every 5 years. Have your cholesterol levels checked more often if:  Your lipid or cholesterol levels are high.  You are older than 26 years of age.  You are at high risk for heart disease. What should I know about cancer screening? Depending on your health history and family history, you may need to have cancer screening at various ages. This may include screening for:  Breast  cancer.  Cervical cancer.  Colorectal cancer.  Skin cancer.  Lung cancer. What should I know about heart disease, diabetes, and high blood pressure? Blood pressure and heart disease  High blood pressure causes heart disease and increases the risk of stroke. This is more likely to develop in people who have high blood pressure readings, are of African descent, or are overweight.  Have your blood pressure checked: ? Every 3-5 years if you are 61-26 years of age. ? Every year if you are 72 years old or older. Diabetes Have regular diabetes screenings. This checks your fasting blood sugar level. Have the screening done:  Once every three years after age 43 if you are at a normal weight and have a low risk for diabetes.  More often and at a younger age if you are overweight or have a high risk for diabetes. What should I know about preventing infection? Hepatitis B If you have a higher risk for hepatitis B, you should be screened for this virus. Talk with your health care provider to find out if you are at risk for hepatitis B infection. Hepatitis C Testing is recommended for:  Everyone born from [birth year] through 1963.  Anyone with known risk factors for hepatitis C. Sexually transmitted infections (STIs)  Get screened for STIs, including gonorrhea and chlamydia, if: ? You are sexually active and are younger than 26 years of age. ? You are older than 26 years of age and your health care provider tells you that you are at  risk for this type of infection. ? Your sexual activity has changed since you were last screened, and you are at increased risk for chlamydia or gonorrhea. Ask your health care provider if you are at risk.  Ask your health care provider about whether you are at high risk for HIV. Your health care provider may recommend a prescription medicine to help prevent HIV infection. If you choose to take medicine to prevent HIV, you should first get tested for HIV. You should  then be tested every 3 months for as long as you are taking the medicine. Pregnancy  If you are about to stop having your period (premenopausal) and you may become pregnant, seek counseling before you get pregnant.  Take 400 to 800 micrograms (mcg) of folic acid every day if you become pregnant.  Ask for birth control (contraception) if you want to prevent pregnancy. Osteoporosis and menopause Osteoporosis is a disease in which the bones lose minerals and strength with aging. This can result in bone fractures. If you are 66 years old or older, or if you are at risk for osteoporosis and fractures, ask your health care provider if you should:  Be screened for bone loss.  Take a calcium or vitamin D supplement to lower your risk of fractures.  Be given hormone replacement therapy (HRT) to treat symptoms of menopause. Follow these instructions at home: Lifestyle  Do not use any products that contain nicotine or tobacco, such as cigarettes, e-cigarettes, and chewing tobacco. If you need help quitting, ask your health care provider.  Do not use street drugs.  Do not share needles.  Ask your health care provider for help if you need support or information about quitting drugs. Alcohol use  Do not drink alcohol if: ? Your health care provider tells you not to drink. ? You are pregnant, may be pregnant, or are planning to become pregnant.  If you drink alcohol: ? Limit how much you use to 0-1 drink a day. ? Limit intake if you are breastfeeding.  Be aware of how much alcohol is in your drink. In the [place] one drink equals one 12 oz bottle of beer (355 mL), one 5 oz glass of wine (148 mL), or one 1 oz glass of hard liquor (44 mL). General instructions  Schedule regular health, dental, and eye exams.  Stay current with your vaccines.  Tell your health care provider if: ? You often feel depressed. ? You have ever been abused or do not feel safe at home. Summary  Adopting a  healthy lifestyle and getting preventive care are important in promoting health and wellness.  Follow your health care provider's instructions about healthy diet, exercising, and getting tested or screened for diseases.  Follow your health care provider's instructions on monitoring your cholesterol and blood pressure. This information is not intended to replace advice given to you by your health care provider. Make sure you discuss any questions you have with your health care provider. Document Released: 09/25/2010 Document Revised: 03/05/2018 Document Reviewed: 03/05/2018 Elsevier Patient Education  2020 [employer].

## 2018-12-05 NOTE — Progress Notes (Signed)
Chief Complaint  Patient presents with  . Annual Exam    Pt has no concerns     HPI: Patient  Kathleen Hodges  28 y.o. comes in today for Preventive Health Care visit     Sees [facility] dr [facility]  Periods ok  Off ocps  Although not  Advised by   opth  In case  Feels more comfortable off  Since she has had  Elevated optic disc ? Not Papilledema?   Local ? Poss bICH?  orig thought from hig dose VIT a but not now and following   Vision is olk  Wears glasses   Rare [place] if albuterol just when gets sick   Sees derm :   On spironolactone   Doing ok     Also concerta ocass use  Since  Working from home   No sx  Or new risk relationship  but  Asks for sti screening also   Health Maintenance  Topic Date Due  . INFLUENZA VACCINE  10/25/2018  . PAP-Cervical Cytology Screening  12/22/2019 (Originally 11/02/2018)  . TETANUS/TDAP  10/27/2024  . HIV Screening  Completed   Health Maintenance Review LIFESTYLE:  Exercise:   3 x per month.  Tobacco/ETS: no Alcohol:   Weekly  3 x per week  Sugar beverages:  Daily lemonade .  Sleep: about 7 hours  Drug use: no HH of 1 pet dog .   Small yorkie Work: in [employer].  Just dec to 30 hours per week and  Pt job at [employer]  To get out     ROS:  GEN/ HEENT: No fever, significant weight changes sweats headaches vision problems hearing changes, CV/ PULM; No chest pain shortness of breath cough, syncope,edema  change in exercise tolerance. GI /GU: No adominal pain, vomiting, change in bowel habits. No blood in the stool. No significant GU symptoms. SKIN/HEME: ,no acute skin rashes suspicious lesions or bleeding. No lymphadenopathy, nodules, masses.  NEURO/ PSYCH:  No neurologic signs such as weakness numbness. No depression anxiety. IMM/ Allergy: No unusual infections.  Allergy .   REST of 12 system review negative except as per HPI   Past Medical History:  Diagnosis Date  . Abdominal pain, recurrent 06/20/2011  . Fracture of  metatarsal bone of right foot    Third 2016 basketball  . OM (otitis media)    hx  . SHIN SPLINTS 04/21/2009   Qualifier: Diagnosis of  By: Regis Bill MD, Standley Brooking     Past Surgical History:  Procedure Laterality Date  . ANTERIOR CRUCIATE LIGAMENT REPAIR Left    murphy wainer 2015  . TYMPANOSTOMY     age 43    Family History  Problem Relation Age of Onset  . Asthma Mother   . Uterine cancer Mother   . Hyperlipidemia Father   . Obesity Sister   . Asthma Brother   . Headache Neg Hx     Social History   Socioeconomic History  . Marital status: Single    Spouse name: Not on file  . Number of children: 0  . Years of education: 16+  . Highest education level: Not on file  Occupational History  . Occupation: Ship broker  Social Needs  . Financial resource strain: Not on file  . Food insecurity    Worry: Not on file    Inability: Not on file  . Transportation needs    Medical: Not on file    Non-medical: Not on  file  Tobacco Use  . Smoking status: Passive Smoke Exposure - Never Smoker  . Smokeless tobacco: Never Used  Substance and Sexual Activity  . Alcohol use: Yes    Comment: Couple of times per month  . Drug use: No  . Sexual activity: Not on file  Lifestyle  . Physical activity    Days per week: Not on file    Minutes per session: Not on file  . Stress: Not on file  Relationships  . Social Herbalist on phone: Not on file    Gets together: Not on file    Attends religious service: Not on file    Active member of club or organization: Not on file    Attends meetings of clubs or organizations: Not on file    Relationship status: Not on file  Other Topics Concern  . Not on file  Social History Narrative   HH of 5   Born in [place]   Father smokes not in house   Pet dog   GDS  Graduated    Basket ball and  [employer]      [employer], site now to [employer]   Lives w/ roommate    Right-handed   Caffeine: 1 cup coffee every few months          Outpatient Medications Prior to Visit  Medication Sig Dispense Refill  . albuterol (VENTOLIN HFA) 108 (90 Base) MCG/ACT inhaler Inhale 2 puffs into the lungs every 4 (four) hours as needed for wheezing or shortness of breath. 2 puffs pre exercise  1 hours 1 Inhaler 2  . methylphenidate (CONCERTA) 18 MG PO CR tablet TAKE 1 TABLET BY MOUTH EVERY DAY    . spironolactone (ALDACTONE) 25 MG tablet TAKE 2 TABLETS BY MOUTH EVERY MORNING AND 1 TABLET EVERY NIGHT    . selenium sulfide (SELSUN) 2.5 % shampoo APPLY TO TRUNK & LEAVE ON OVER NIGHT. [place] OFF THE NEXT DAY. USE FOR 1 WEEK, THEN PERIODICALLY (Patient not taking: Reported on 12/08/2018) 120 mL 1  . ALPRAZolam (XANAX) 0.5 MG tablet Take 1 tablet up to one hour prior to MRI for anxiety, may repeat x 1 if needed. (Patient not taking: Reported on 12/08/2018) 2 tablet 0   No facility-administered medications prior to visit.      EXAM:  BP 112/64 (BP Location: Right Arm, Patient Position: Sitting, Cuff Size: Normal)   Pulse 82   Temp 98.9 F (37.2 C) (Temporal)   Ht 5\' 10"  (1.778 m)   Wt 177 lb (80.3 kg)   SpO2 96%   BMI 25.40 kg/m   Body mass index is 25.4 kg/m. Wt Readings from Last 3 Encounters:  12/08/18 177 lb (80.3 kg)  11/18/17 177 lb 8 oz (80.5 kg)  10/29/16 176 lb 3.2 oz (79.9 kg)    Physical Exam: Vital signs reviewed [id number] is a well-developed well-nourished alert cooperative    who appearsr stated age in no acute distress.  HEENT: normocephalic atraumatic , Eyes: PERRL EOM's full, conjunctiva clear,  Ears: no deformity EAC's clear TMs with normal landmarks. Mouth:deferred masked NECK: supple without masses, thyromegaly or bruits. CHEST/PULM:  Clear to auscultation and percussion breath sounds equal no wheeze , rales or rhonchi. No chest wall deformities or tenderness. Breast: normal by inspection . No dimpling, discharge, masses, tenderness or discharge .  CV: PMI is nondisplaced, S1 S2 no gallops, murmurs, rubs.  Peripheral pulses are full without delay.No JVD .  ABDOMEN: Bowel sounds normal nontender  No guard or rebound, no hepato splenomegal no CVA tenderness. Extremtities:  No clubbing cyanosis or edema, no acute joint swelling or redness no focal atrophy NEURO:  Oriented x3, cranial nerves 3-12 appear to be intact, no obvious focal weakness,gait within normal limits no abnormal reflexes or asymmetrical SKIN: No acute rashes normal turgor, color, no bruising or petechiae. PSYCH: Oriented, good eye contact, no obvious depression anxiety, cognition and judgment appear normal. LN: no cervical axillary inguinal adenopathy  Lab Results  Component Value Date   WBC 4.1 11/18/2017   HGB 12.8 11/18/2017   HCT 39.2 11/18/2017   PLT 285.0 11/18/2017   GLUCOSE 88 11/18/2017   CHOL 202 (H) 11/18/2017   TRIG 52.0 11/18/2017   HDL 65.70 11/18/2017   LDLDIRECT 144.3 08/22/2012   LDLCALC 126 (H) 11/18/2017   ALT 14 11/18/2017   AST 17 11/18/2017   NA 140 11/18/2017   K 3.9 11/18/2017   CL 106 11/18/2017   CREATININE 1.06 11/18/2017   BUN 16 11/18/2017   CO2 28 11/18/2017   TSH 2.54 11/18/2017    BP Readings from Last 3 Encounters:  12/08/18 112/64  11/18/17 110/62  10/29/16 128/79    Plan reviewed  w patient   ASSESSMENT AND PLAN:  Discussed the following assessment and plan:    ICD-10-CM   1. Visit for preventive health examination  123456 Basic metabolic panel    CBC with Differential/Platelet    Hepatic function panel    Lipid panel    TSH  2. Exercise-induced asthma  123XX123 Basic metabolic panel    CBC with Differential/Platelet    Hepatic function panel    Lipid panel    TSH  3. Need for influenza vaccination  Z23 Flu Vaccine QUAD 6+ mos PF IM (Fluarix Quad PF)  4. Routine screening for STI (sexually transmitted infection)  123456 Basic metabolic panel    CBC with Differential/Platelet    Hepatic function panel     Lipid panel    TSH    RPR    HIV Antibody (routine testing w rflx)    Hepatitis C antibody    Urine cytology ancillary only  5. Screening for HIV (human immunodeficiency virus)  Z11.4 HIV Antibody (routine testing w rflx)  6. Need for hepatitis C screening test  Z11.59 Lipid panel    Hepatitis C antibody  7. Medication management  [phone]    Will notify you  of labs when available.  Counseled. Healthy habits etc .  Patient Care Team: , Standley Brooking, MD as PCP - General (Internal Medicine) Servando Salina, MD as Consulting Physician (Obstetrics and Gynecology) Jolyn Nap, MD as Referring Physician (Ophthalmology) Patient Instructions   Glad you are doing well.  consider LARC or iud if needed in future . Work on limit sugar drinks and   Adding some exercise ( walking is ok!)    Health Maintenance, Female Adopting a healthy lifestyle and getting preventive care are important in promoting health and wellness. Ask your health care provider about:  The right schedule for you to have regular tests and exams.  Things you can do on your own to prevent diseases and keep yourself healthy. What should I know about diet, weight, and exercise? Eat a healthy diet   Eat a diet that includes plenty of vegetables, fruits, low-fat dairy products, and lean protein.  Do not eat a lot of foods that are  high in solid fats, added sugars, or sodium. Maintain a healthy weight Body mass index (BMI) is used to identify weight problems. It estimates body fat based on height and weight. Your health care provider can help determine your BMI and help you achieve or maintain a healthy weight. Get regular exercise Get regular exercise. This is one of the most important things you can do for your health. Most adults should:  Exercise for at least 150 minutes each week. The exercise should increase your heart rate and make you sweat (moderate-intensity exercise).  Do strengthening exercises at least  twice a week. This is in addition to the moderate-intensity exercise.  Spend less time sitting. Even light physical activity can be beneficial. Watch cholesterol and blood lipids Have your blood tested for lipids and cholesterol at 26 years of age, then have this test every 5 years. Have your cholesterol levels checked more often if:  Your lipid or cholesterol levels are high.  You are older than 26 years of age.  You are at high risk for heart disease. What should I know about cancer screening? Depending on your health history and family history, you may need to have cancer screening at various ages. This may include screening for:  Breast cancer.  Cervical cancer.  Colorectal cancer.  Skin cancer.  Lung cancer. What should I know about heart disease, diabetes, and high blood pressure? Blood pressure and heart disease  High blood pressure causes heart disease and increases the risk of stroke. This is more likely to develop in people who have high blood pressure readings, are of African descent, or are overweight.  Have your blood pressure checked: ? Every 3-5 years if you are 23-42 years of age. ? Every year if you are 59 years old or older. Diabetes Have regular diabetes screenings. This checks your fasting blood sugar level. Have the screening done:  Once every three years after age 76 if you are at a normal weight and have a low risk for diabetes.  More often and at a younger age if you are overweight or have a high risk for diabetes. What should I know about preventing infection? Hepatitis B If you have a higher risk for hepatitis B, you should be screened for this virus. Talk with your health care provider to find out if you are at risk for hepatitis B infection. Hepatitis C Testing is recommended for:  Everyone born from [birth year] through 1963.  Anyone with known risk factors for hepatitis C. Sexually transmitted infections (STIs)  Get screened for STIs, including  gonorrhea and chlamydia, if: ? You are sexually active and are younger than 26 years of age. ? You are older than 26 years of age and your health care provider tells you that you are at risk for this type of infection. ? Your sexual activity has changed since you were last screened, and you are at increased risk for chlamydia or gonorrhea. Ask your health care provider if you are at risk.  Ask your health care provider about whether you are at high risk for HIV. Your health care provider may recommend a prescription medicine to help prevent HIV infection. If you choose to take medicine to prevent HIV, you should first get tested for HIV. You should then be tested every 3 months for as long as you are taking the medicine. Pregnancy  If you are about to stop having your period (premenopausal) and you may become pregnant, seek counseling before you get pregnant.  Take 400 to 800 micrograms (mcg) of folic acid every day if you become pregnant.  Ask for birth control (contraception) if you want to prevent pregnancy. Osteoporosis and menopause Osteoporosis is a disease in which the bones lose minerals and strength with aging. This can result in bone fractures. If you are 56 years old or older, or if you are at risk for osteoporosis and fractures, ask your health care provider if you should:  Be screened for bone loss.  Take a calcium or vitamin D supplement to lower your risk of fractures.  Be given hormone replacement therapy (HRT) to treat symptoms of menopause. Follow these instructions at home: Lifestyle  Do not use any products that contain nicotine or tobacco, such as cigarettes, e-cigarettes, and chewing tobacco. If you need help quitting, ask your health care provider.  Do not use street drugs.  Do not share needles.  Ask your health care provider for help if you need support or information about quitting drugs. Alcohol use  Do not drink alcohol if: ? Your health care provider  tells you not to drink. ? You are pregnant, may be pregnant, or are planning to become pregnant.  If you drink alcohol: ? Limit how much you use to 0-1 drink a day. ? Limit intake if you are breastfeeding.  Be aware of how much alcohol is in your drink. In the [place] one drink equals one 12 oz bottle of beer (355 mL), one 5 oz glass of wine (148 mL), or one 1 oz glass of hard liquor (44 mL). General instructions  Schedule regular health, dental, and eye exams.  Stay current with your vaccines.  Tell your health care provider if: ? You often feel depressed. ? You have ever been abused or do not feel safe at home. Summary  Adopting a healthy lifestyle and getting preventive care are important in promoting health and wellness.  Follow your health care provider's instructions about healthy diet, exercising, and getting tested or screened for diseases.  Follow your health care provider's instructions on monitoring your cholesterol and blood pressure. This information is not intended to replace advice given to you by your health care provider. Make sure you discuss any questions you have with your health care provider. Document Released: 09/25/2010 Document Revised: 03/05/2018 Document Reviewed: 03/05/2018 Elsevier Patient Education  2020 [place]  M.D.

## 2018-12-08 ENCOUNTER — Ambulatory Visit (INDEPENDENT_AMBULATORY_CARE_PROVIDER_SITE_OTHER): Payer: 59 | Admitting: Internal Medicine

## 2018-12-08 ENCOUNTER — Other Ambulatory Visit: Payer: Self-pay

## 2018-12-08 ENCOUNTER — Encounter: Payer: 59 | Admitting: Internal Medicine

## 2018-12-08 ENCOUNTER — Other Ambulatory Visit (HOSPITAL_COMMUNITY)
Admission: RE | Admit: 2018-12-08 | Discharge: 2018-12-08 | Disposition: A | Discharge status: Home / Self Care | Payer: 59 | Source: Ambulatory Visit | Attending: Internal Medicine | Admitting: Internal Medicine

## 2018-12-08 ENCOUNTER — Encounter: Payer: Self-pay | Admitting: Internal Medicine

## 2018-12-08 VITALS — BP 112/64 | HR 82 | Temp 98.9°F | Ht 70.0 in | Wt 177.0 lb

## 2018-12-08 DIAGNOSIS — Z0000 Encounter for general adult medical examination without abnormal findings: Secondary | ICD-10-CM | POA: Diagnosis not present

## 2018-12-08 DIAGNOSIS — J45990 Exercise induced bronchospasm: Secondary | ICD-10-CM

## 2018-12-08 DIAGNOSIS — Z113 Encounter for screening for infections with a predominantly sexual mode of transmission: Secondary | ICD-10-CM | POA: Diagnosis not present

## 2018-12-08 DIAGNOSIS — Z1159 Encounter for screening for other viral diseases: Secondary | ICD-10-CM

## 2018-12-08 DIAGNOSIS — Z114 Encounter for screening for human immunodeficiency virus [HIV]: Secondary | ICD-10-CM | POA: Diagnosis not present

## 2018-12-08 DIAGNOSIS — Z23 Encounter for immunization: Secondary | ICD-10-CM | POA: Diagnosis not present

## 2018-12-08 DIAGNOSIS — Z79899 Other long term (current) drug therapy: Secondary | ICD-10-CM

## 2018-12-08 LAB — CBC WITH DIFFERENTIAL/PLATELET
Basophils Absolute: 0 10*3/uL (ref 0.0–0.1)
Basophils Relative: 0.6 % (ref 0.0–3.0)
Eosinophils Absolute: 0 10*3/uL (ref 0.0–0.7)
Eosinophils Relative: 1.2 % (ref 0.0–5.0)
HCT: 39 % (ref 36.0–46.0)
Hemoglobin: 13.1 g/dL (ref 12.0–15.0)
Lymphocytes Relative: 35.6 % (ref 12.0–46.0)
Lymphs Abs: 1.2 10*3/uL (ref 0.7–4.0)
MCHC: 33.6 g/dL (ref 30.0–36.0)
MCV: 88.9 fl (ref 78.0–100.0)
Monocytes Absolute: 0.3 10*3/uL (ref 0.1–1.0)
Monocytes Relative: 9.7 % (ref 3.0–12.0)
Neutro Abs: 1.8 10*3/uL (ref 1.4–7.7)
Neutrophils Relative %: 52.9 % (ref 43.0–77.0)
Platelets: 265 10*3/uL (ref 150.0–400.0)
RBC: 4.39 Mil/uL (ref 3.87–5.11)
RDW: 13.7 % (ref 11.5–15.5)
WBC: 3.4 10*3/uL — ABNORMAL LOW (ref 4.0–10.5)

## 2018-12-08 LAB — BASIC METABOLIC PANEL
BUN: 12 mg/dL (ref 6–23)
CO2: 26 mEq/L (ref 19–32)
Calcium: 10 mg/dL (ref 8.4–10.5)
Chloride: 103 mEq/L (ref 96–112)
Creatinine, Ser: 0.84 mg/dL (ref 0.40–1.20)
GFR: 99.24 mL/min (ref >60.00)
Glucose, Bld: 76 mg/dL (ref 70–99)
Potassium: 4 mEq/L (ref 3.5–5.1)
Sodium: 138 mEq/L (ref 135–145)

## 2018-12-08 LAB — LIPID PANEL
Cholesterol: 191 mg/dL (ref 0–200)
HDL: 57.9 mg/dL (ref >39.00)
LDL Cholesterol: 125 mg/dL — ABNORMAL HIGH (ref 0–99)
NonHDL: 133.52
Total CHOL/HDL Ratio: 3
Triglycerides: 44 mg/dL (ref 0.0–149.0)
VLDL: 8.8 mg/dL (ref 0.0–40.0)

## 2018-12-08 LAB — HEPATIC FUNCTION PANEL
ALT: 8 U/L (ref 0–35)
AST: 11 U/L (ref 0–37)
Albumin: 4.4 g/dL (ref 3.5–5.2)
Alkaline Phosphatase: 48 U/L (ref 39–117)
Bilirubin, Direct: 0.1 mg/dL (ref 0.0–0.3)
Total Bilirubin: 0.6 mg/dL (ref 0.2–1.2)
Total Protein: 7.9 g/dL (ref 6.0–8.3)

## 2018-12-08 LAB — TSH: TSH: 3.01 u[IU]/mL (ref 0.35–4.50)

## 2018-12-09 LAB — HEPATITIS C ANTIBODY
Hepatitis C Ab: NONREACTIVE
SIGNAL TO CUT-OFF: 0.02 (ref <1.00)

## 2018-12-09 LAB — URINE CYTOLOGY ANCILLARY ONLY
Chlamydia: NEGATIVE
Neisseria Gonorrhea: NEGATIVE

## 2018-12-09 LAB — FLUORESCENT TREPONEMAL AB(FTA)-IGG-BLD: Fluorescent Treponemal ABS: NONREACTIVE

## 2018-12-09 LAB — HIV ANTIBODY (ROUTINE TESTING W REFLEX): HIV 1&2 Ab, 4th Generation: NONREACTIVE

## 2018-12-09 LAB — RPR: RPR Ser Ql: REACTIVE — AB

## 2018-12-09 LAB — RPR TITER: RPR Titer: 1:2 {titer} — ABNORMAL HIGH

## 2019-04-08 ENCOUNTER — Telehealth: Payer: Self-pay | Admitting: *Deleted

## 2019-04-08 NOTE — Telephone Encounter (Signed)
Copied from [place] [id number]. Topic: Complaint - Billing/Coding >> Apr 08, 2019 10:47 AM Virl Axe D wrote: DOS: 9.14.2020 Details of complaint: Pt's insurance stated claim for this date service needs to be resubmitted. Pt has two insurances and she is the holder of the primary insurance but it was submitted to the secondary policy under her parents. How would the patient like to see this issue resolved? Resubmit the claim to primary insurance and follow up with pt so bill does not go to collections.   Will automatically be routed to [place] pool.

## 2019-04-08 NOTE — Telephone Encounter (Signed)
Please advise 

## 2020-04-27 ENCOUNTER — Encounter: Payer: Commercial Managed Care - HMO | Admitting: Internal Medicine

## 2022-10-02 ENCOUNTER — Ambulatory Visit (HOSPITAL_COMMUNITY): Payer: Self-pay
